# Patient Record
Sex: Female | Born: 1985 | Race: White | Hispanic: No | Marital: Married | Smoking: Former smoker
Health system: Southern US, Community
[De-identification: ages and names within clinical notes are randomized; demographics above are authoritative.]

## PROBLEM LIST (undated history)

## (undated) ENCOUNTER — Inpatient Hospital Stay (HOSPITAL_COMMUNITY): Payer: Self-pay

## (undated) DIAGNOSIS — F419 Anxiety disorder, unspecified: Secondary | ICD-10-CM

## (undated) DIAGNOSIS — E039 Hypothyroidism, unspecified: Secondary | ICD-10-CM

## (undated) DIAGNOSIS — Z8659 Personal history of other mental and behavioral disorders: Secondary | ICD-10-CM

## (undated) DIAGNOSIS — G5600 Carpal tunnel syndrome, unspecified upper limb: Secondary | ICD-10-CM

## (undated) DIAGNOSIS — J3089 Other allergic rhinitis: Secondary | ICD-10-CM

## (undated) DIAGNOSIS — B029 Zoster without complications: Secondary | ICD-10-CM

## (undated) DIAGNOSIS — R0600 Dyspnea, unspecified: Secondary | ICD-10-CM

## (undated) DIAGNOSIS — Z9889 Other specified postprocedural states: Secondary | ICD-10-CM

## (undated) DIAGNOSIS — E669 Obesity, unspecified: Secondary | ICD-10-CM

## (undated) DIAGNOSIS — J302 Other seasonal allergic rhinitis: Secondary | ICD-10-CM

## (undated) DIAGNOSIS — R112 Nausea with vomiting, unspecified: Secondary | ICD-10-CM

## (undated) DIAGNOSIS — T8859XA Other complications of anesthesia, initial encounter: Secondary | ICD-10-CM

## (undated) HISTORY — PX: WISDOM TOOTH EXTRACTION: SHX21

## (undated) HISTORY — PX: MINOR CARPAL TUNNEL: SHX6472

## (undated) HISTORY — PX: CHOLECYSTECTOMY: SHX55

---

## 2015-01-09 ENCOUNTER — Inpatient Hospital Stay (HOSPITAL_COMMUNITY): Payer: BC Managed Care – PPO

## 2015-01-09 ENCOUNTER — Encounter (HOSPITAL_COMMUNITY): Payer: Self-pay | Admitting: *Deleted

## 2015-01-09 ENCOUNTER — Inpatient Hospital Stay (HOSPITAL_COMMUNITY)
Admission: AD | Admit: 2015-01-09 | Discharge: 2015-01-09 | Disposition: A | Discharge status: Home / Self Care | Payer: BC Managed Care – PPO | Source: Ambulatory Visit | Attending: Obstetrics and Gynecology | Admitting: Obstetrics and Gynecology

## 2015-01-09 DIAGNOSIS — O360110 Maternal care for anti-D [Rh] antibodies, first trimester, not applicable or unspecified: Secondary | ICD-10-CM | POA: Diagnosis not present

## 2015-01-09 DIAGNOSIS — O26891 Other specified pregnancy related conditions, first trimester: Secondary | ICD-10-CM | POA: Insufficient documentation

## 2015-01-09 DIAGNOSIS — O4691 Antepartum hemorrhage, unspecified, first trimester: Secondary | ICD-10-CM

## 2015-01-09 DIAGNOSIS — O209 Hemorrhage in early pregnancy, unspecified: Secondary | ICD-10-CM

## 2015-01-09 DIAGNOSIS — Z87891 Personal history of nicotine dependence: Secondary | ICD-10-CM | POA: Insufficient documentation

## 2015-01-09 DIAGNOSIS — R109 Unspecified abdominal pain: Secondary | ICD-10-CM | POA: Diagnosis present

## 2015-01-09 DIAGNOSIS — Z3A01 Less than 8 weeks gestation of pregnancy: Secondary | ICD-10-CM | POA: Insufficient documentation

## 2015-01-09 DIAGNOSIS — Z6791 Unspecified blood type, Rh negative: Secondary | ICD-10-CM | POA: Insufficient documentation

## 2015-01-09 HISTORY — DX: Anxiety disorder, unspecified: F41.9

## 2015-01-09 HISTORY — DX: Obesity, unspecified: E66.9

## 2015-01-09 LAB — URINALYSIS, ROUTINE W REFLEX MICROSCOPIC
BILIRUBIN URINE: NEGATIVE
GLUCOSE, UA: NEGATIVE mg/dL
Ketones, ur: NEGATIVE mg/dL
Leukocytes, UA: NEGATIVE
Nitrite: NEGATIVE
PH: 6 (ref 5.0–8.0)
Protein, ur: NEGATIVE mg/dL
Urobilinogen, UA: 1 mg/dL (ref 0.0–1.0)

## 2015-01-09 LAB — ABO/RH: ABO/RH(D): O NEG

## 2015-01-09 LAB — CBC WITH DIFFERENTIAL/PLATELET
BASOS ABS: 0 10*3/uL (ref 0.0–0.1)
Basophils Relative: 0 % (ref 0–1)
EOS PCT: 1 % (ref 0–5)
Eosinophils Absolute: 0.2 10*3/uL (ref 0.0–0.7)
HEMATOCRIT: 37.6 % (ref 36.0–46.0)
Hemoglobin: 12.8 g/dL (ref 12.0–15.0)
LYMPHS ABS: 4.5 10*3/uL — AB (ref 0.7–4.0)
LYMPHS PCT: 30 % (ref 12–46)
MCH: 28.4 pg (ref 26.0–34.0)
MCHC: 34 g/dL (ref 30.0–36.0)
MCV: 83.4 fL (ref 78.0–100.0)
MONO ABS: 1 10*3/uL (ref 0.1–1.0)
Monocytes Relative: 7 % (ref 3–12)
NEUTROS ABS: 9.1 10*3/uL — AB (ref 1.7–7.7)
Neutrophils Relative %: 62 % (ref 43–77)
PLATELETS: 327 10*3/uL (ref 150–400)
RBC: 4.51 MIL/uL (ref 3.87–5.11)
RDW: 13.9 % (ref 11.5–15.5)
WBC: 14.8 10*3/uL — ABNORMAL HIGH (ref 4.0–10.5)

## 2015-01-09 LAB — WET PREP, GENITAL
Clue Cells Wet Prep HPF POC: NONE SEEN
TRICH WET PREP: NONE SEEN
YEAST WET PREP: NONE SEEN

## 2015-01-09 LAB — URINE MICROSCOPIC-ADD ON

## 2015-01-09 LAB — HCG, QUANTITATIVE, PREGNANCY: hCG, Beta Chain, Quant, S: 17773 m[IU]/mL — ABNORMAL HIGH (ref <5)

## 2015-01-09 LAB — POCT PREGNANCY, URINE: PREG TEST UR: POSITIVE — AB

## 2015-01-09 MED ORDER — RHO D IMMUNE GLOBULIN 1500 UNIT/2ML IJ SOSY
300.0000 ug | PREFILLED_SYRINGE | Freq: Once | INTRAMUSCULAR | Status: AC
  Administered 2015-01-09: 300 ug via INTRAMUSCULAR
  Filled 2015-01-09: qty 2

## 2015-01-09 NOTE — MAU Note (Signed)
Patient states she took 3 positive HPTs and started having cramping and light spotting today.  LMP November 30, 2014.

## 2015-01-09 NOTE — Discharge Instructions (Signed)
Rh Incompatibility Rh incompatibility is a condition that occurs during pregnancy if a woman has Rh-negative blood and her baby has Rh-positive blood. "Rh-negative" and "Rh-positive" refer to whether or not the blood has an Rh factor. An Rh factor is a specific protein found on the surface of red blood cells. If a woman has Rh factor, she is Rh-positive. If she does not have an Rh factor, she is Rh-negative. Having or not having an Rh factor does not affect the mother's general health. However, it can cause problems during pregnancy.  WHAT KIND OF PROBLEMS CAN Rh INCOMPATIBILITY CAUSE? During pregnancy, blood from the baby can cross into the mother's bloodstream, especially during delivery. If a mother is Rh-negative and the baby is Rh-positive, the mother's defense system will react to the baby's blood as if it was a foreign substance and will create proteins (antibodies). This is called sensitization. Once the mother is sensitized, her Rh antibodies will cross the placenta to the baby and attack the baby's Rh-positive blood as if it is a harmful substance.  Rh incompatibility can also happen if the Rh-negative pregnant woman is exposed to the Rh factor during a blood transfusion with Rh-positive blood.  HOW DOES THIS CONDITION AFFECT MY BABY? The Rh antibodies that attack and destroy the baby's red blood cells can lead to hemolytic disease in the baby. Hemolytic disease is when the red blood cells break down. This can cause:   Yellowing of the skin and eyes (jaundice).  The body to not have enough healthy red blood cells (anemia).   Brain damage.   Heart failure.   Death.  These antibodies usually do not cause problems during a first pregnancy. This is because the blood from the baby often times crosses into the mother's bloodstream during delivery, and the baby is born before many of the antibodies can develop. However, the antibodies stay in your body once they have formed. Because of this,  Rh incompatibility is more likely to cause problems in second or later pregnancies (if the baby is Rh-positive).  HOW IS THIS CONDITION DIAGNOSED? When a woman becomes pregnant, blood tests may be done to find out her blood type and Rh factor. If the woman is Rh-negative, she also may have another blood test called an antibody screen. The antibody screen shows whether she has Rh antibodies in her blood. If she does, it means she was exposed to Rh-positive blood before, and she is at risk for Rh incompatibility.  To find out whether the baby is developing hemolytic anemia and how serious it is, caregivers may use more advanced tests, such as ultrasonography (commonly known as ultrasound).  HOW IS Rh INCOMPATIBILITY TREATED?  Rh incompatibility is treated with a shot of medicine called Rho (D) immune globulin. This medicine keeps the woman's body from making antibodies that can cause serious problems in the baby or future babies.  Two shots will be given, one at around your seventh month of pregnancy and the other within 72 hours of your baby being born. If you are Rh-negative, you will need this medicine every time you have a baby with Rh-positive blood. If you already have antibodies in your blood, Rho (D) immune globulin will not help. Your doctor will not give you this medicine, but will watch your pregnancy closely for problems instead.  This shot may also be given to an Rh-negative woman when the risk of blood transfer between the mom and baby is high. The risk is high with:  An amniocentesis.   A miscarriage or an abortion.   An ectopic pregnancy.   Any vaginal bleeding during pregnancy.  Document Released: 10/13/2001 Document Revised: 04/28/2013 Document Reviewed: 08/05/2012 [facility] Patient Information 2015 [place], [place]. This information is not intended to replace advice given to you by your health care provider. Make sure you discuss any questions you have with your health care  provider.  Pelvic Rest Pelvic rest is sometimes recommended for women when:   The placenta is partially or completely covering the opening of the cervix (placenta previa).  There is bleeding between the uterine wall and the amniotic sac in the first trimester (subchorionic hemorrhage).  The cervix begins to open without labor starting (incompetent cervix, cervical insufficiency).  The labor is too early (preterm labor). HOME CARE INSTRUCTIONS  Do not have sexual intercourse, stimulation, or an orgasm.  Do not use tampons, douche, or put anything in the vagina.  Do not lift anything over 10 pounds (4.5 kg).  Avoid strenuous activity or straining your pelvic muscles. SEEK MEDICAL CARE IF:  You have any vaginal bleeding during pregnancy. Treat this as a potential emergency.  You have cramping pain felt low in the stomach (stronger than menstrual cramps).  You notice vaginal discharge (watery, mucus, or bloody).  You have a low, dull backache.  There are regular contractions or uterine tightening. SEEK IMMEDIATE MEDICAL CARE IF: You have vaginal bleeding and have placenta previa.  Document Released: 08/18/2010 Document Revised: 07/16/2011 Document Reviewed: 08/18/2010 [facility] Patient Information 2015 [place], [place]. This information is not intended to replace advice given to you by your health care provider. Make sure you discuss any questions you have with your health care provider.

## 2015-01-09 NOTE — MAU Provider Note (Signed)
History     CSN: [id number]  Arrival date and time: 01/09/15 1648   First Provider Initiated Contact with Patient 01/09/15 1754      Chief Complaint  Patient presents with  . Vaginal Bleeding  . Abdominal Cramping   HPI [name] [name] 29 y.o. G1P0 @[redacted]w[redacted]d  presents to MAU complaining of bleeding and cramping in setting of positive home pregnancy test last week.  She first noted her symptoms a couple hours ago.  It started with some spotting.  It was brownish and noted only with wiping.  Shortly after noting the spotting, she started cramping.  It is 4/10, dull, across both sides of lower abdomen.  No radiation.  Has not noticed aggravating or alleviating factors.  No meds used.  No ice/heat.  Last IC was this morning.  Denies fever, weakness, SOB, syncope, HA.    OB History    Gravida Para Term Preterm AB TAB SAB Ectopic Multiple Living   1               Past Medical History  Diagnosis Date  . Obese   . Anxiety     Past Surgical History  Procedure Laterality Date  . Cholecystectomy    . Minor carpal tunnel      History reviewed. No pertinent family history.  Social History  Substance Use Topics  . Smoking status: Former Smoker    Types: Cigarettes  . Smokeless tobacco: None  . Alcohol Use: No    Allergies:  Allergies  Allergen Reactions  . Ibuprofen Hives  . Sulfa Antibiotics Hives    Prescriptions prior to admission  Medication Sig Dispense Refill Last Dose  . albuterol (PROVENTIL HFA;VENTOLIN HFA) 108 (90 BASE) MCG/ACT inhaler Inhale 1 puff into the lungs every 6 (six) hours as needed for wheezing or shortness of breath.   rescue  . cetirizine (ZYRTEC) 10 MG tablet Take 10 mg by mouth daily.   01/09/2015 at Unknown time  . EPINEPHrine (EPIPEN 2-PAK) 0.3 mg/0.3 mL IJ SOAJ injection Inject 0.3 mg into the muscle once.   rescue  . fluticasone (FLONASE) 50 MCG/ACT nasal spray Place 1 spray into both nostrils daily as needed for allergies or rhinitis.   Past Week at  Unknown time  . levothyroxine (SYNTHROID, LEVOTHROID) 25 MCG tablet Take 25 mcg by mouth daily before breakfast.   01/09/2015 at Unknown time  . montelukast (SINGULAIR) 10 MG tablet Take 10 mg by mouth at bedtime.   01/08/2015 at Unknown time  . Prenatal Vit-Fe Fumarate-FA (PRENATAL MULTIVITAMIN) TABS tablet Take 1 tablet by mouth daily at 12 noon.   01/08/2015 at Unknown time  . venlafaxine XR (EFFEXOR-XR) 150 MG 24 hr capsule Take 150 mg by mouth daily with breakfast.   01/09/2015 at Unknown time    ROS Pertinent ROS in HPI.  All other systems are negative.   Physical Exam   Blood pressure 123/58, pulse 104, temperature 98.3 F (36.8 C), temperature source Oral, resp. rate 18, height 5\' 6"  (1.676 m), weight 307 lb 3.2 oz (139.345 kg), last menstrual period 11/30/2014, SpO2 100 %.  Physical Exam  Constitutional: She appears well-developed and well-nourished. No distress.  HENT:  Head: Normocephalic and atraumatic.  Eyes: EOM are normal.  Neck: Normal range of motion.  Cardiovascular: Normal rate.   Respiratory: Effort normal. No respiratory distress.  GI: Soft.  Genitourinary:  Scant tan colored discharge.  No active bleeding from os.  Poor exam secondary to habitus.  Unable to appreciate fundus  or anything in adnexa.  No pain with bimanual exam.  Musculoskeletal: Normal range of motion.  Neurological: She is alert.  Skin: Skin is warm and dry.  Psychiatric: She has a normal mood and affect.   Results for orders placed or performed during the hospital encounter of 01/09/15 (from the past 24 hour(s))  Urinalysis, Routine w reflex microscopic (not at [facility])     Status: Abnormal   Collection Time: 01/09/15  5:20 PM  Result Value Ref Range   Color, Urine YELLOW YELLOW   APPearance CLEAR CLEAR   Specific Gravity, Urine >1.030 (H) 1.005 - 1.030   pH 6.0 5.0 - 8.0   Glucose, UA NEGATIVE NEGATIVE mg/dL   Hgb urine dipstick MODERATE (A) NEGATIVE   Bilirubin Urine NEGATIVE NEGATIVE   Ketones,  ur NEGATIVE NEGATIVE mg/dL   Protein, ur NEGATIVE NEGATIVE mg/dL   Urobilinogen, UA 1.0 0.0 - 1.0 mg/dL   Nitrite NEGATIVE NEGATIVE   Leukocytes, UA NEGATIVE NEGATIVE  Urine microscopic-add on     Status: Abnormal   Collection Time: 01/09/15  5:20 PM  Result Value Ref Range   Squamous Epithelial / LPF FEW (A) RARE   WBC, UA 0-2 <3 WBC/hpf   RBC / HPF 0-2 <3 RBC/hpf  Pregnancy, urine POC     Status: Abnormal   Collection Time: 01/09/15  5:44 PM  Result Value Ref Range   Preg Test, Ur POSITIVE (A) NEGATIVE  Wet prep, genital     Status: Abnormal   Collection Time: 01/09/15  6:05 PM  Result Value Ref Range   Yeast Wet Prep HPF POC NONE SEEN NONE SEEN   Trich, Wet Prep NONE SEEN NONE SEEN   Clue Cells Wet Prep HPF POC NONE SEEN NONE SEEN   WBC, Wet Prep HPF POC FEW (A) NONE SEEN  CBC with Differential/Platelet     Status: Abnormal   Collection Time: 01/09/15  6:20 PM  Result Value Ref Range   WBC 14.8 (H) 4.0 - 10.5 K/uL   RBC 4.51 3.87 - 5.11 MIL/uL   Hemoglobin 12.8 12.0 - 15.0 g/dL   HCT 37.6 36.0 - 46.0 %   MCV 83.4 78.0 - 100.0 fL   MCH 28.4 26.0 - 34.0 pg   MCHC 34.0 30.0 - 36.0 g/dL   RDW 13.9 11.5 - 15.5 %   Platelets 327 150 - 400 K/uL   Neutrophils Relative % 62 43 - 77 %   Neutro Abs 9.1 (H) 1.7 - 7.7 K/uL   Lymphocytes Relative 30 12 - 46 %   Lymphs Abs 4.5 (H) 0.7 - 4.0 K/uL   Monocytes Relative 7 3 - 12 %   Monocytes Absolute 1.0 0.1 - 1.0 K/uL   Eosinophils Relative 1 0 - 5 %   Eosinophils Absolute 0.2 0.0 - 0.7 K/uL   Basophils Relative 0 0 - 1 %   Basophils Absolute 0.0 0.0 - 0.1 K/uL  ABO/Rh     Status: None (Preliminary result)   Collection Time: 01/09/15  6:20 PM  Result Value Ref Range   ABO/RH(D) O NEG   hCG, quantitative, pregnancy     Status: Abnormal   Collection Time: 01/09/15  6:20 PM  Result Value Ref Range   hCG, Beta Chain, Quant, S 17773 (H) <5 mIU/mL   US Ob Comp Less 14 Wks  01/09/2015   CLINICAL DATA:  29 year old female pregnant  patient with pelvic cramping and vaginal spotting.  EXAM: OBSTETRIC <14 WK [place] AND TRANSVAGINAL OB US  TECHNIQUE: Both transabdominal and transvaginal ultrasound examinations were performed for complete evaluation of the gestation as well as the maternal uterus, adnexal regions, and pelvic cul-de-sac. Transvaginal technique was performed to assess early pregnancy.  COMPARISON:  No priors.  FINDINGS: Intrauterine gestational sac: Single gestational sac noted in the fundal portion of the endometrial canal.  Yolk sac:  Present.  Embryo:  Present.  Cardiac Activity: Present.  Heart Rate: 110  bpm  CRL:  4.5  mm   6 w   1 d                  [place] EDC: 09/03/2015  Maternal uterus/adnexae: Small area adjacent to the gestational sac in the endometrial canal demonstrating heterogeneous hypoechoic echotexture reportedly corresponded to some prominent vessels per discussion with sonographer. No definite subchorionic hemorrhage. Bilateral ovaries are normal in appearance. No significant free fluid within the cul-de-sac.  IMPRESSION: 1. Single viable IUP with estimated gestational age of [redacted] weeks and 1 day and normal fetal heart rate of 110 beats per minute. 2. No acute findings.   Electronically Signed   By: Vinnie Langton M.D.   On: 01/09/2015 19:32   US Ob Transvaginal  01/09/2015   CLINICAL DATA:  29 year old female pregnant patient with pelvic cramping and vaginal spotting.  EXAM: OBSTETRIC <14 WK [place] AND TRANSVAGINAL OB US  TECHNIQUE: Both transabdominal and transvaginal ultrasound examinations were performed for complete evaluation of the gestation as well as the maternal uterus, adnexal regions, and pelvic cul-de-sac. Transvaginal technique was performed to assess early pregnancy.  COMPARISON:  No priors.  FINDINGS: Intrauterine gestational sac: Single gestational sac noted in the fundal portion of the endometrial canal.  Yolk sac:  Present.  Embryo:  Present.  Cardiac Activity: Present.  Heart Rate: 110  bpm  CRL:  4.5   mm   6 w   1 d                  [place] EDC: 09/03/2015  Maternal uterus/adnexae: Small area adjacent to the gestational sac in the endometrial canal demonstrating heterogeneous hypoechoic echotexture reportedly corresponded to some prominent vessels per discussion with sonographer. No definite subchorionic hemorrhage. Bilateral ovaries are normal in appearance. No significant free fluid within the cul-de-sac.  IMPRESSION: 1. Single viable IUP with estimated gestational age of [redacted] weeks and 1 day and normal fetal heart rate of 110 beats per minute. 2. No acute findings.   Electronically Signed   By: Vinnie Langton M.D.   On: 01/09/2015 19:32    MAU Course  Procedures  MDM Ectopic workup ordered to eval vaginal spotting in setting of positive pregnancy test Per U/S pregnancy is intrauterine and viable at [redacted]w[redacted]d with cardiac activity.  Images reviewed Lab indicates negative blood type therefore pt requires rh workup and rhophylac.    Assessment and Plan  A: Vaginal bleeding in early pregnancy Rh negative status - Rhophylac in MAU  P: Discharge to home Follow up with OB care provider for [facility] asap PNV asap Any worsening of symptoms, return to MAU  Jaclyn Prime, Collene Leyden 01/09/2015, 5:55 PM

## 2015-01-10 LAB — RPR: RPR: NONREACTIVE

## 2015-01-10 LAB — HIV ANTIBODY (ROUTINE TESTING W REFLEX): HIV SCREEN 4TH GENERATION: NONREACTIVE

## 2015-01-11 LAB — RH IG WORKUP (INCLUDES ABO/RH)
ABO/RH(D): O NEG
Antibody Screen: NEGATIVE
GESTATIONAL AGE(WKS): 6
UNIT DIVISION: 0

## 2015-01-11 LAB — GC/CHLAMYDIA PROBE AMP (~~LOC~~) NOT AT ARMC
CHLAMYDIA, DNA PROBE: NEGATIVE
NEISSERIA GONORRHEA: NEGATIVE

## 2015-02-17 LAB — OB RESULTS CONSOLE RUBELLA ANTIBODY, IGM: Rubella: IMMUNE

## 2015-02-17 LAB — OB RESULTS CONSOLE GC/CHLAMYDIA: Gonorrhea: NEGATIVE

## 2015-02-17 LAB — OB RESULTS CONSOLE HEPATITIS B SURFACE ANTIGEN: HEP B S AG: NEGATIVE

## 2015-06-19 ENCOUNTER — Inpatient Hospital Stay (HOSPITAL_COMMUNITY): Payer: BC Managed Care – PPO

## 2015-06-19 ENCOUNTER — Encounter (HOSPITAL_COMMUNITY): Payer: Self-pay | Admitting: *Deleted

## 2015-06-19 ENCOUNTER — Inpatient Hospital Stay (HOSPITAL_COMMUNITY)
Admission: AD | Admit: 2015-06-19 | Discharge: 2015-06-27 | DRG: 765 | Disposition: A | Discharge status: Home / Self Care | Payer: BC Managed Care – PPO | Source: Ambulatory Visit | Attending: Obstetrics and Gynecology | Admitting: Obstetrics and Gynecology

## 2015-06-19 DIAGNOSIS — O99214 Obesity complicating childbirth: Secondary | ICD-10-CM | POA: Diagnosis present

## 2015-06-19 DIAGNOSIS — O42919 Preterm premature rupture of membranes, unspecified as to length of time between rupture and onset of labor, unspecified trimester: Secondary | ICD-10-CM | POA: Diagnosis not present

## 2015-06-19 DIAGNOSIS — Z87891 Personal history of nicotine dependence: Secondary | ICD-10-CM

## 2015-06-19 DIAGNOSIS — Z3A29 29 weeks gestation of pregnancy: Secondary | ICD-10-CM | POA: Diagnosis not present

## 2015-06-19 DIAGNOSIS — F419 Anxiety disorder, unspecified: Secondary | ICD-10-CM | POA: Diagnosis present

## 2015-06-19 DIAGNOSIS — O9902 Anemia complicating childbirth: Secondary | ICD-10-CM | POA: Diagnosis present

## 2015-06-19 DIAGNOSIS — O99344 Other mental disorders complicating childbirth: Secondary | ICD-10-CM | POA: Diagnosis present

## 2015-06-19 DIAGNOSIS — Z6841 Body Mass Index (BMI) 40.0 and over, adult: Secondary | ICD-10-CM

## 2015-06-19 DIAGNOSIS — E039 Hypothyroidism, unspecified: Secondary | ICD-10-CM | POA: Diagnosis present

## 2015-06-19 DIAGNOSIS — O99284 Endocrine, nutritional and metabolic diseases complicating childbirth: Secondary | ICD-10-CM | POA: Diagnosis present

## 2015-06-19 DIAGNOSIS — O42913 Preterm premature rupture of membranes, unspecified as to length of time between rupture and onset of labor, third trimester: Principal | ICD-10-CM | POA: Diagnosis present

## 2015-06-19 HISTORY — DX: Other seasonal allergic rhinitis: J30.2

## 2015-06-19 HISTORY — DX: Other allergic rhinitis: J30.89

## 2015-06-19 HISTORY — DX: Carpal tunnel syndrome, unspecified upper limb: G56.00

## 2015-06-19 HISTORY — DX: Hypothyroidism, unspecified: E03.9

## 2015-06-19 HISTORY — DX: Personal history of other mental and behavioral disorders: Z86.59

## 2015-06-19 LAB — CBC
HEMATOCRIT: 32.6 % — AB (ref 36.0–46.0)
HEMOGLOBIN: 10.9 g/dL — AB (ref 12.0–15.0)
MCH: 27.5 pg (ref 26.0–34.0)
MCHC: 33.4 g/dL (ref 30.0–36.0)
MCV: 82.3 fL (ref 78.0–100.0)
Platelets: 288 10*3/uL (ref 150–400)
RBC: 3.96 MIL/uL (ref 3.87–5.11)
RDW: 13.7 % (ref 11.5–15.5)
WBC: 20.5 10*3/uL — ABNORMAL HIGH (ref 4.0–10.5)

## 2015-06-19 LAB — AMNISURE RUPTURE OF MEMBRANE (ROM) NOT AT ARMC: Amnisure ROM: POSITIVE

## 2015-06-19 LAB — TYPE AND SCREEN
ABO/RH(D): O NEG
Antibody Screen: NEGATIVE

## 2015-06-19 LAB — URINALYSIS, ROUTINE W REFLEX MICROSCOPIC
Bilirubin Urine: NEGATIVE
GLUCOSE, UA: NEGATIVE mg/dL
Hgb urine dipstick: NEGATIVE
Ketones, ur: NEGATIVE mg/dL
LEUKOCYTES UA: NEGATIVE
Nitrite: NEGATIVE
PH: 7 (ref 5.0–8.0)
Protein, ur: NEGATIVE mg/dL
Specific Gravity, Urine: 1.015 (ref 1.005–1.030)

## 2015-06-19 LAB — POCT FERN TEST: POCT Fern Test: POSITIVE

## 2015-06-19 MED ORDER — VENLAFAXINE HCL ER 150 MG PO CP24
150.0000 mg | ORAL_CAPSULE | Freq: Every day | ORAL | Status: DC
  Filled 2015-06-19 (×2): qty 1

## 2015-06-19 MED ORDER — SODIUM CHLORIDE 0.9% FLUSH
3.0000 mL | INTRAVENOUS | Status: DC | PRN
  Administered 2015-06-21: 3 mL via INTRAVENOUS
  Filled 2015-06-19: qty 3

## 2015-06-19 MED ORDER — SODIUM CHLORIDE 0.9% FLUSH
3.0000 mL | Freq: Two times a day (BID) | INTRAVENOUS | Status: DC
  Administered 2015-06-21 – 2015-06-23 (×5): 3 mL via INTRAVENOUS

## 2015-06-19 MED ORDER — PRENATAL MULTIVITAMIN CH
1.0000 | ORAL_TABLET | Freq: Every day | ORAL | Status: DC
  Administered 2015-06-19 – 2015-06-22 (×4): 1 via ORAL
  Filled 2015-06-19 (×5): qty 1

## 2015-06-19 MED ORDER — MONTELUKAST SODIUM 10 MG PO TABS
10.0000 mg | ORAL_TABLET | Freq: Every day | ORAL | Status: DC
  Administered 2015-06-19 – 2015-06-22 (×4): 10 mg via ORAL
  Filled 2015-06-19 (×5): qty 1

## 2015-06-19 MED ORDER — AMOXICILLIN 500 MG PO CAPS
500.0000 mg | ORAL_CAPSULE | Freq: Three times a day (TID) | ORAL | Status: DC
  Administered 2015-06-21 – 2015-06-23 (×7): 500 mg via ORAL
  Filled 2015-06-19 (×10): qty 1

## 2015-06-19 MED ORDER — SODIUM CHLORIDE 0.9 % IV SOLN: 250.0000 mL | INTRAVENOUS | Status: DC | PRN

## 2015-06-19 MED ORDER — FLUTICASONE PROPIONATE 50 MCG/ACT NA SUSP
1.0000 | Freq: Every day | NASAL | Status: DC | PRN
  Filled 2015-06-19: qty 16

## 2015-06-19 MED ORDER — BETAMETHASONE SOD PHOS & ACET 6 (3-3) MG/ML IJ SUSP
12.0000 mg | INTRAMUSCULAR | Status: AC
  Administered 2015-06-19 – 2015-06-20 (×2): 12 mg via INTRAMUSCULAR
  Filled 2015-06-19 (×2): qty 2

## 2015-06-19 MED ORDER — LEVOTHYROXINE SODIUM 25 MCG PO TABS
25.0000 ug | ORAL_TABLET | Freq: Every day | ORAL | Status: DC
Start: 2015-06-20 — End: 2015-06-23
  Administered 2015-06-20 – 2015-06-23 (×4): 25 ug via ORAL
  Filled 2015-06-19 (×6): qty 1

## 2015-06-19 MED ORDER — CALCIUM CARBONATE ANTACID 500 MG PO CHEW
2.0000 | CHEWABLE_TABLET | ORAL | Status: DC | PRN
Start: 2015-06-19 — End: 2015-06-23

## 2015-06-19 MED ORDER — ZOLPIDEM TARTRATE 5 MG PO TABS: 5.0000 mg | ORAL_TABLET | Freq: Every evening | ORAL | Status: DC | PRN

## 2015-06-19 MED ORDER — AZITHROMYCIN 500 MG PO TABS
500.0000 mg | ORAL_TABLET | Freq: Every day | ORAL | Status: DC
  Administered 2015-06-19 – 2015-06-23 (×5): 500 mg via ORAL
  Filled 2015-06-19 (×2): qty 2
  Filled 2015-06-19: qty 1
  Filled 2015-06-19 (×3): qty 2

## 2015-06-19 MED ORDER — HYDROCORTISONE 1 % EX CREA
1.0000 "application " | TOPICAL_CREAM | Freq: Two times a day (BID) | CUTANEOUS | Status: DC
  Administered 2015-06-19 – 2015-06-22 (×4): 1 via TOPICAL
  Filled 2015-06-19: qty 28

## 2015-06-19 MED ORDER — ACETAMINOPHEN 325 MG PO TABS: 650.0000 mg | ORAL_TABLET | ORAL | Status: DC | PRN

## 2015-06-19 MED ORDER — LORATADINE 10 MG PO TABS
10.0000 mg | ORAL_TABLET | Freq: Every day | ORAL | Status: DC
  Administered 2015-06-20 – 2015-06-23 (×4): 10 mg via ORAL
  Filled 2015-06-19 (×5): qty 1

## 2015-06-19 MED ORDER — SODIUM CHLORIDE 0.9 % IV SOLN
2.0000 g | Freq: Four times a day (QID) | INTRAVENOUS | Status: AC
  Administered 2015-06-19 – 2015-06-21 (×8): 2 g via INTRAVENOUS
  Filled 2015-06-19 (×10): qty 2000

## 2015-06-19 MED ORDER — ALBUTEROL SULFATE (2.5 MG/3ML) 0.083% IN NEBU: 3.0000 mL | INHALATION_SOLUTION | Freq: Four times a day (QID) | RESPIRATORY_TRACT | Status: DC | PRN

## 2015-06-19 MED ORDER — DOCUSATE SODIUM 100 MG PO CAPS
100.0000 mg | ORAL_CAPSULE | Freq: Every day | ORAL | Status: DC
  Administered 2015-06-20 – 2015-06-23 (×4): 100 mg via ORAL
  Filled 2015-06-19 (×6): qty 1

## 2015-06-19 MED ORDER — PRENATAL MULTIVITAMIN CH: 1.0000 | ORAL_TABLET | Freq: Every day | ORAL | Status: DC

## 2015-06-19 NOTE — H&P (Addendum)
[name] [name] is a 30 y.o. female G1P0 @ 29+3 wks presenting for ROM.  Denies ctx & vb.  + FM  History OB History    Gravida Para Term Preterm AB TAB SAB Ectopic Multiple Living   1         0     Past Medical History  Diagnosis Date  . Obese   . Anxiety   . Hypothyroid   . Seasonal allergies   . Environmental and seasonal allergies    Past Surgical History  Procedure Laterality Date  . Cholecystectomy    . Minor carpal tunnel     Family History: family history is not on file. Social History:  reports that she has quit smoking. Her smoking use included Cigarettes. She does not have any smokeless tobacco history on file. She reports that she does not drink alcohol or use illicit drugs.   Prenatal Transfer Tool  Maternal Diabetes: No Genetic Screening: Normal Maternal Ultrasounds/Referrals: Normal Fetal Ultrasounds or other Referrals:  None Maternal Substance Abuse:  No Significant Maternal Medications:  None Significant Maternal Lab Results:  None Other Comments:  None  ROS  Dilation: 1 Effacement (%): 50 Exam by:: Robyne Askew, NP Blood pressure 129/75, pulse 110, temperature 97.9 F (36.6 C), temperature source Oral, resp. rate 18, last menstrual period 11/30/2014, SpO2 99 %. Exam Physical Exam  Gen - NAD Abd - gravid, NT Ext - NT, no edema PV (by NP @ MAU)  + pool, no bleeding,  + fern & amniosure.  1cm dilated Prenatal labs: ABO, Rh: --/--/O NEG, O NEG (09/04 1820) Antibody: NEG (09/04 1820) Rubella:   RPR: Non Reactive (09/04 1820)  HBsAg:    HIV: Non Reactive (09/04 1820)  GBS:     Assessment/Plan:  29+3 weeks with PPROM Admit BMZ series Latency abx Ultrasound    [name] [name] 06/19/2015, 2:28 PM

## 2015-06-19 NOTE — MAU Note (Signed)
Pt C/O leaking that started around 1210 today, felt wet & changed pantiliner, then soaked through her pants, clear fluid.   Has some mid abdominal discomfort.  Denies bleeding.

## 2015-06-19 NOTE — MAU Note (Signed)
No obvious leaking at this time. Fern slide pending.

## 2015-06-19 NOTE — MAU Provider Note (Signed)
History     CSN: [id number]  Arrival date and time: 06/19/15 1247   First Provider Initiated Contact with Patient 06/19/15 1351       Chief Complaint  Patient presents with  . Rupture of Membranes   HPI [name] [name] is a 30 y.o. G1P0 at [redacted]w[redacted]d who presents with r/o SROM. Reports leaking clear fluid since noon today. Has continued leaking. Denies abdominal pain or vaginal bleeding. Positive fetal movement.  Denies complications with pregnancy.    OB History    Gravida Para Term Preterm AB TAB SAB Ectopic Multiple Living   1         0      Past Medical History  Diagnosis Date  . Obese   . Anxiety   . Hypothyroid   . Seasonal allergies   . Environmental and seasonal allergies     Past Surgical History  Procedure Laterality Date  . Cholecystectomy    . Minor carpal tunnel      History reviewed. No pertinent family history.  Social History  Substance Use Topics  . Smoking status: Former Smoker    Types: Cigarettes  . Smokeless tobacco: None  . Alcohol Use: No    Allergies:  Allergies  Allergen Reactions  . Ibuprofen Hives  . Sulfa Antibiotics Hives    Prescriptions prior to admission  Medication Sig Dispense Refill Last Dose  . calcium carbonate (TUMS - DOSED IN MG ELEMENTAL CALCIUM) 500 MG chewable tablet Chew 1 tablet by mouth daily.   Past Week at Unknown time  . cetirizine (ZYRTEC) 10 MG tablet Take 10 mg by mouth daily.   06/19/2015 at Unknown time  . hydrocortisone cream 1 % Apply 1 application topically 2 (two) times daily.   06/19/2015 at Unknown time  . levothyroxine (SYNTHROID, LEVOTHROID) 25 MCG tablet Take 25 mcg by mouth daily before breakfast.   06/19/2015 at Unknown time  . montelukast (SINGULAIR) 10 MG tablet Take 10 mg by mouth at bedtime.   06/18/2015 at Unknown time  . Prenatal Vit-Fe Fumarate-FA (PRENATAL MULTIVITAMIN) TABS tablet Take 1 tablet by mouth daily at 12 noon.   06/18/2015 at Unknown time  . venlafaxine XR (EFFEXOR-XR) 150 MG 24  hr capsule Take 150 mg by mouth daily with breakfast.   06/19/2015 at Unknown time  . albuterol (PROVENTIL HFA;VENTOLIN HFA) 108 (90 BASE) MCG/ACT inhaler Inhale 1 puff into the lungs every 6 (six) hours as needed for wheezing or shortness of breath.   rescue  . EPINEPHrine (EPIPEN 2-PAK) 0.3 mg/0.3 mL IJ SOAJ injection Inject 0.3 mg into the muscle once.   rescue  . fluticasone (FLONASE) 50 MCG/ACT nasal spray Place 1 spray into both nostrils daily as needed for allergies or rhinitis.   prn    Review of Systems  Constitutional: Negative for fever and chills.  Gastrointestinal: Negative.   Genitourinary: Negative for dysuria.       + LOF No vaginal bleeding   Physical Exam   Blood pressure 129/75, pulse 110, temperature 97.9 F (36.6 C), temperature source Oral, resp. rate 18, last menstrual period 11/30/2014, SpO2 99 %.  Physical Exam  Nursing note and vitals reviewed. Constitutional: She is oriented to person, place, and time. She appears well-developed and well-nourished. No distress.  HENT:  Head: Normocephalic and atraumatic.  Eyes: Conjunctivae are normal. Right eye exhibits no discharge. Left eye exhibits no discharge. No scleral icterus.  Neck: Normal range of motion.  Cardiovascular:  No murmur heard. Respiratory: Effort normal.  No respiratory distress.  Genitourinary:  + pooling. Moderate amount of clear watery fluid.   Neurological: She is alert and oriented to person, place, and time.  Skin: Skin is warm and dry. She is not diaphoretic.  Psychiatric: She has a normal mood and affect. Her behavior is normal. Judgment and thought content normal.   Fetal Tracing:  Baseline: 150 Variability: moderate Accelerations: 15x15 Decelerations: none   Toco: none   MAU Course  Procedures Results for orders placed or performed during the hospital encounter of 06/19/15 (from the past 24 hour(s))  Urinalysis, Routine w reflex microscopic (not at [facility])     Status: None    Collection Time: 06/19/15  1:12 PM  Result Value Ref Range   Color, Urine YELLOW YELLOW   APPearance CLEAR CLEAR   Specific Gravity, Urine 1.015 1.005 - 1.030   pH 7.0 5.0 - 8.0   Glucose, UA NEGATIVE NEGATIVE mg/dL   Hgb urine dipstick NEGATIVE NEGATIVE   Bilirubin Urine NEGATIVE NEGATIVE   Ketones, ur NEGATIVE NEGATIVE mg/dL   Protein, ur NEGATIVE NEGATIVE mg/dL   Nitrite NEGATIVE NEGATIVE   Leukocytes, UA NEGATIVE NEGATIVE  Amnisure rupture of membrane (rom)not at [facility]     Status: None   Collection Time: 06/19/15  1:44 PM  Result Value Ref Range   Amnisure ROM POSITIVE   Fern Test     Status: None   Collection Time: 06/19/15  1:50 PM  Result Value Ref Range   POCT Fern Test Positive = ruptured amniotic membanes     MDM Pooling +, FERN +, amnisure + Category 1 tracing, no contractions 1410- S/w Dr. Julien Girt. Notified that NICU is full, will call neonatologist for admission.   Assessment and Plan  PPROM @ [redacted]w[redacted]d  [name] 06/19/2015, 1:47 PM

## 2015-06-20 LAB — CBC
HCT: 30.5 % — ABNORMAL LOW (ref 36.0–46.0)
HEMOGLOBIN: 10 g/dL — AB (ref 12.0–15.0)
MCH: 27.2 pg (ref 26.0–34.0)
MCHC: 32.8 g/dL (ref 30.0–36.0)
MCV: 83.1 fL (ref 78.0–100.0)
Platelets: 283 10*3/uL (ref 150–400)
RBC: 3.67 MIL/uL — AB (ref 3.87–5.11)
RDW: 13.7 % (ref 11.5–15.5)
WBC: 22.4 10*3/uL — AB (ref 4.0–10.5)

## 2015-06-20 LAB — COMPREHENSIVE METABOLIC PANEL
ALK PHOS: 104 U/L (ref 38–126)
ALT: 19 U/L (ref 14–54)
AST: 27 U/L (ref 15–41)
Albumin: 3 g/dL — ABNORMAL LOW (ref 3.5–5.0)
Anion gap: 9 (ref 5–15)
BILIRUBIN TOTAL: 0.3 mg/dL (ref 0.3–1.2)
BUN: 6 mg/dL (ref 6–20)
CALCIUM: 8.6 mg/dL — AB (ref 8.9–10.3)
CO2: 22 mmol/L (ref 22–32)
CREATININE: 0.46 mg/dL (ref 0.44–1.00)
Chloride: 107 mmol/L (ref 101–111)
GFR calc Af Amer: >60 mL/min (ref >60)
Glucose, Bld: 181 mg/dL — ABNORMAL HIGH (ref 65–99)
Potassium: 3.2 mmol/L — ABNORMAL LOW (ref 3.5–5.1)
SODIUM: 138 mmol/L (ref 135–145)
TOTAL PROTEIN: 6.7 g/dL (ref 6.5–8.1)

## 2015-06-20 LAB — URIC ACID: URIC ACID, SERUM: 3.1 mg/dL (ref 2.3–6.6)

## 2015-06-20 MED ORDER — LACTATED RINGERS IV BOLUS (SEPSIS)
250.0000 mL | Freq: Once | INTRAVENOUS | Status: AC
  Administered 2015-06-20 (×2): 1000 mL via INTRAVENOUS

## 2015-06-20 MED ORDER — VENLAFAXINE HCL ER 75 MG PO CP24: 75.0000 mg | ORAL_CAPSULE | Freq: Every day | ORAL | Status: DC

## 2015-06-20 MED ORDER — VENLAFAXINE HCL ER 75 MG PO CP24
75.0000 mg | ORAL_CAPSULE | Freq: Every day | ORAL | Status: DC
  Administered 2015-06-20 – 2015-06-23 (×4): 75 mg via ORAL
  Filled 2015-06-20 (×5): qty 1

## 2015-06-20 NOTE — Progress Notes (Signed)
29 4/7 weeks  No HA, no vision change, no epigastric pain No bleeding, no UCs  Filed Vitals:   06/20/15 0500 06/20/15 0807  BP:  149/102  Pulse:  124  Temp:    Resp: 16 20   Lungs CTA Cor RRR Abdomen no epigastric tenderness Uterus soft, NT DTR 2+ without clonus FHT + accels UCs none tracing  U/S  Vtx  A/P: PPROM at 29 4/7 weeks         Elevated BP this am         PIH labs         IV fluid bolus         NICU consult

## 2015-06-21 LAB — CULTURE, BETA STREP (GROUP B ONLY)

## 2015-06-21 NOTE — Consult Note (Signed)
[place] 06/21/2015    9:19 AM  Neonatal Medicine Consultation         [name] [name]          MRN:  [id number]  I was called at the request of the patient's obstetrician (Dr. Julien Girt) to speak to this patient due to potential preterm delivery as early as 10 weeks.  The patient's prenatal course includes premature rupture of membranes and elevated blood pressure recently.  She is 29 5/7 weeks currently.  She is admitted to antenatal unit, and is receiving treatment that includes betamethasone (completed), antibiotics.  She is being evaluated for PIH.  The baby is a female, and is mom's first baby.  I reviewed expectations for a baby born at 41+ weeks, including survival, length of stay, morbidities such as respiratory distress, IVH, infection, feeding intolerance, retinopathy.  I described how we provide respiratory and feeding support.  Mom was not planning to breast feed, but I urged her to do so at least while her baby is in the NICU given the numerous benefits to her baby.  I let mom know that the baby's outlook generally improves the longer she remains undelivered.  Mom describes her fluid leaking to be improved since admission.  Hopefully she will remain stable and undelivered for many more days.  I spent 20 minutes reviewing the record, speaking to the patient, and entering appropriate documentation.  More than 50% of the time was spent face to face with patient.   _____________________ Electronically Signed By: Roosevelt Locks, MD Neonatologist

## 2015-06-21 NOTE — Progress Notes (Signed)
S:  Patient is doing well.  No complaints.  Leaking small amount.  O:  BP 123/66 mmHg  Pulse 110  Temp(Src) 98.7 F (37.1 C) (Oral)  Resp 16  Ht 5' 5.5" (1.664 m)  Wt 138.347 kg (305 lb)  BMI 49.96 kg/m2  SpO2 100%  LMP 11/30/2014 (Approximate) Results for orders placed or performed during the hospital encounter of 06/19/15 (from the past 24 hour(s))  CBC     Status: Abnormal   Collection Time: 06/20/15  8:59 AM  Result Value Ref Range   WBC 22.4 (H) 4.0 - 10.5 K/uL   RBC 3.67 (L) 3.87 - 5.11 MIL/uL   Hemoglobin 10.0 (L) 12.0 - 15.0 g/dL   HCT 30.5 (L) 36.0 - 46.0 %   MCV 83.1 78.0 - 100.0 fL   MCH 27.2 26.0 - 34.0 pg   MCHC 32.8 30.0 - 36.0 g/dL   RDW 13.7 11.5 - 15.5 %   Platelets 283 150 - 400 K/uL  Comprehensive metabolic panel     Status: Abnormal   Collection Time: 06/20/15  8:59 AM  Result Value Ref Range   Sodium 138 135 - 145 mmol/L   Potassium 3.2 (L) 3.5 - 5.1 mmol/L   Chloride 107 101 - 111 mmol/L   CO2 22 22 - 32 mmol/L   Glucose, Bld 181 (H) 65 - 99 mg/dL   BUN 6 6 - 20 mg/dL   Creatinine, Ser 0.46 0.44 - 1.00 mg/dL   Calcium 8.6 (L) 8.9 - 10.3 mg/dL   Total Protein 6.7 6.5 - 8.1 g/dL   Albumin 3.0 (L) 3.5 - 5.0 g/dL   AST 27 15 - 41 U/L   ALT 19 14 - 54 U/L   Alkaline Phosphatase 104 38 - 126 U/L   Total Bilirubin 0.3 0.3 - 1.2 mg/dL   GFR calc non Af Amer >60 >60 mL/min   GFR calc Af Amer >60 >60 mL/min   Anion gap 9 5 - 15  Uric acid     Status: None   Collection Time: 06/20/15  8:59 AM  Result Value Ref Range   Uric Acid, Serum 3.1 2.3 - 6.6 mg/dL   Abdomen is soft and non tender Uterus is non tender  IMPRESSION: IUP at 29 w 5 days PPROM on 2/12  PLAN: Continue latency antibiotics Status post steroids Expectant management.

## 2015-06-22 LAB — TYPE AND SCREEN
ABO/RH(D): O NEG
ANTIBODY SCREEN: NEGATIVE

## 2015-06-22 NOTE — Progress Notes (Signed)
No c/o.  Rare LOF.  Active FM.  No CTX, f/c or abdominal pain.  VSS. AF.  FHT Cat I last pm  Gen: A&O x 3 Abd: soft, NT, gravid Ext: no c/c/e  29yo G1 at [redacted]w[redacted]d with PPROM -D4/7 of latency abx -s/p BMZ -Monitor for s/sx of chorio -VTX on last u/s 2/12  Linda Hedges, DO

## 2015-06-23 ENCOUNTER — Inpatient Hospital Stay (HOSPITAL_COMMUNITY): Payer: BC Managed Care – PPO | Admitting: Anesthesiology

## 2015-06-23 ENCOUNTER — Encounter (HOSPITAL_COMMUNITY)
Admission: AD | Disposition: A | Discharge status: Home / Self Care | Payer: Self-pay | Source: Ambulatory Visit | Attending: Obstetrics and Gynecology

## 2015-06-23 ENCOUNTER — Encounter (HOSPITAL_COMMUNITY): Payer: Self-pay | Admitting: Anesthesiology

## 2015-06-23 SURGERY — Surgical Case
Anesthesia: Regional

## 2015-06-23 MED ORDER — SIMETHICONE 80 MG PO CHEW
80.0000 mg | CHEWABLE_TABLET | Freq: Three times a day (TID) | ORAL | Status: DC
Start: 2015-06-24 — End: 2015-06-27
  Administered 2015-06-24 – 2015-06-27 (×10): 80 mg via ORAL
  Filled 2015-06-23 (×11): qty 1

## 2015-06-23 MED ORDER — ONDANSETRON HCL 4 MG/2ML IJ SOLN
INTRAMUSCULAR | Status: AC
  Filled 2015-06-23: qty 2

## 2015-06-23 MED ORDER — ONDANSETRON HCL 4 MG/2ML IJ SOLN: 4.0000 mg | Freq: Three times a day (TID) | INTRAMUSCULAR | Status: DC | PRN

## 2015-06-23 MED ORDER — PROPOFOL 10 MG/ML IV BOLUS
INTRAVENOUS | Status: AC
  Filled 2015-06-23: qty 20

## 2015-06-23 MED ORDER — PRENATAL MULTIVITAMIN CH
1.0000 | ORAL_TABLET | Freq: Every day | ORAL | Status: DC
Start: 2015-06-24 — End: 2015-06-27
  Administered 2015-06-24 – 2015-06-27 (×4): 1 via ORAL
  Filled 2015-06-23 (×4): qty 1

## 2015-06-23 MED ORDER — DEXTROSE IN LACTATED RINGERS 5 % IV SOLN
INTRAVENOUS | Status: DC
  Administered 2015-06-24: 04:00:00 via INTRAVENOUS

## 2015-06-23 MED ORDER — FENTANYL CITRATE (PF) 100 MCG/2ML IJ SOLN
INTRAMUSCULAR | Status: AC
  Filled 2015-06-23: qty 2

## 2015-06-23 MED ORDER — OXYTOCIN 10 UNIT/ML IJ SOLN: 2.5000 [IU]/h | INTRAMUSCULAR | Status: AC

## 2015-06-23 MED ORDER — NALBUPHINE HCL 10 MG/ML IJ SOLN: 5.0000 mg | Freq: Once | INTRAMUSCULAR | Status: DC | PRN

## 2015-06-23 MED ORDER — PHENYLEPHRINE 8 MG IN D5W 100 ML (0.08MG/ML) PREMIX OPTIME
INJECTION | INTRAVENOUS | Status: AC
  Filled 2015-06-23: qty 100

## 2015-06-23 MED ORDER — MEDROXYPROGESTERONE ACETATE 150 MG/ML IM SUSP: 150.0000 mg | INTRAMUSCULAR | Status: DC | PRN

## 2015-06-23 MED ORDER — MENTHOL 3 MG MT LOZG: 1.0000 | LOZENGE | OROMUCOSAL | Status: DC | PRN

## 2015-06-23 MED ORDER — VENLAFAXINE HCL ER 75 MG PO CP24
75.0000 mg | ORAL_CAPSULE | Freq: Every day | ORAL | Status: DC
  Administered 2015-06-24 – 2015-06-27 (×4): 75 mg via ORAL
  Filled 2015-06-23 (×5): qty 1

## 2015-06-23 MED ORDER — NALOXONE HCL 2 MG/2ML IJ SOSY: 1.0000 ug/kg/h | PREFILLED_SYRINGE | INTRAMUSCULAR | Status: DC | PRN

## 2015-06-23 MED ORDER — MEASLES, MUMPS & RUBELLA VAC ~~LOC~~ INJ: 0.5000 mL | INJECTION | Freq: Once | SUBCUTANEOUS | Status: DC

## 2015-06-23 MED ORDER — MORPHINE SULFATE (PF) 0.5 MG/ML IJ SOLN
INTRAMUSCULAR | Status: DC | PRN
  Administered 2015-06-23: .2 mg via INTRATHECAL

## 2015-06-23 MED ORDER — NALBUPHINE HCL 10 MG/ML IJ SOLN: 5.0000 mg | INTRAMUSCULAR | Status: DC | PRN

## 2015-06-23 MED ORDER — CEFAZOLIN SODIUM-DEXTROSE 2-3 GM-% IV SOLR
INTRAVENOUS | Status: AC
  Filled 2015-06-23: qty 50

## 2015-06-23 MED ORDER — ZOLPIDEM TARTRATE 5 MG PO TABS: 5.0000 mg | ORAL_TABLET | Freq: Every evening | ORAL | Status: DC | PRN

## 2015-06-23 MED ORDER — SCOPOLAMINE 1 MG/3DAYS TD PT72: 1.0000 | MEDICATED_PATCH | Freq: Once | TRANSDERMAL | Status: DC

## 2015-06-23 MED ORDER — LACTATED RINGERS IV SOLN
INTRAVENOUS | Status: DC | PRN
  Administered 2015-06-23 (×3): via INTRAVENOUS

## 2015-06-23 MED ORDER — SCOPOLAMINE 1 MG/3DAYS TD PT72
MEDICATED_PATCH | TRANSDERMAL | Status: AC
  Filled 2015-06-23: qty 1

## 2015-06-23 MED ORDER — LEVOTHYROXINE SODIUM 25 MCG PO TABS
25.0000 ug | ORAL_TABLET | Freq: Every day | ORAL | Status: DC
  Administered 2015-06-24 – 2015-06-27 (×4): 25 ug via ORAL
  Filled 2015-06-23 (×5): qty 1

## 2015-06-23 MED ORDER — OXYCODONE HCL 5 MG PO TABS
5.0000 mg | ORAL_TABLET | ORAL | Status: DC | PRN
  Administered 2015-06-24: 5 mg via ORAL
  Filled 2015-06-23: qty 1

## 2015-06-23 MED ORDER — ACETAMINOPHEN 500 MG PO TABS
1000.0000 mg | ORAL_TABLET | Freq: Four times a day (QID) | ORAL | Status: AC
  Administered 2015-06-24 (×3): 1000 mg via ORAL
  Filled 2015-06-23 (×5): qty 2

## 2015-06-23 MED ORDER — OXYCODONE HCL 5 MG PO TABS
10.0000 mg | ORAL_TABLET | ORAL | Status: DC | PRN
  Administered 2015-06-25 – 2015-06-27 (×6): 10 mg via ORAL
  Filled 2015-06-23 (×6): qty 2

## 2015-06-23 MED ORDER — SIMETHICONE 80 MG PO CHEW: 80.0000 mg | CHEWABLE_TABLET | ORAL | Status: DC | PRN

## 2015-06-23 MED ORDER — ACETAMINOPHEN 10 MG/ML IV SOLN
1000.0000 mg | Freq: Once | INTRAVENOUS | Status: AC
  Administered 2015-06-23: 1000 mg via INTRAVENOUS
  Filled 2015-06-23: qty 100

## 2015-06-23 MED ORDER — BUPIVACAINE IN DEXTROSE 0.75-8.25 % IT SOLN
INTRATHECAL | Status: DC | PRN
  Administered 2015-06-23: 1.4 mL via INTRATHECAL

## 2015-06-23 MED ORDER — LANOLIN HYDROUS EX OINT: 1.0000 "application " | TOPICAL_OINTMENT | CUTANEOUS | Status: DC | PRN

## 2015-06-23 MED ORDER — SIMETHICONE 80 MG PO CHEW
80.0000 mg | CHEWABLE_TABLET | ORAL | Status: DC
  Administered 2015-06-23 – 2015-06-26 (×4): 80 mg via ORAL
  Filled 2015-06-23 (×4): qty 1

## 2015-06-23 MED ORDER — LORATADINE 10 MG PO TABS
10.0000 mg | ORAL_TABLET | Freq: Every day | ORAL | Status: DC
  Administered 2015-06-24 – 2015-06-27 (×4): 10 mg via ORAL
  Filled 2015-06-23 (×5): qty 1

## 2015-06-23 MED ORDER — DIPHENHYDRAMINE HCL 25 MG PO CAPS
25.0000 mg | ORAL_CAPSULE | ORAL | Status: DC | PRN
  Administered 2015-06-24: 25 mg via ORAL
  Filled 2015-06-23: qty 1

## 2015-06-23 MED ORDER — OXYTOCIN 10 UNIT/ML IJ SOLN
40.0000 [IU] | INTRAVENOUS | Status: DC | PRN
  Administered 2015-06-23: 40 [IU] via INTRAVENOUS

## 2015-06-23 MED ORDER — WITCH HAZEL-GLYCERIN EX PADS: 1.0000 "application " | MEDICATED_PAD | CUTANEOUS | Status: DC | PRN

## 2015-06-23 MED ORDER — PHENYLEPHRINE 8 MG IN D5W 100 ML (0.08MG/ML) PREMIX OPTIME
INJECTION | INTRAVENOUS | Status: DC | PRN
  Administered 2015-06-23: 60 ug/min via INTRAVENOUS

## 2015-06-23 MED ORDER — FENTANYL CITRATE (PF) 100 MCG/2ML IJ SOLN
INTRAMUSCULAR | Status: DC | PRN
  Administered 2015-06-23: 100 ug via INTRAVENOUS

## 2015-06-23 MED ORDER — ONDANSETRON HCL 4 MG/2ML IJ SOLN
INTRAMUSCULAR | Status: DC | PRN
  Administered 2015-06-23: 4 mg via INTRAVENOUS

## 2015-06-23 MED ORDER — SENNOSIDES-DOCUSATE SODIUM 8.6-50 MG PO TABS
2.0000 | ORAL_TABLET | ORAL | Status: DC
Start: 2015-06-24 — End: 2015-06-27
  Administered 2015-06-23 – 2015-06-26 (×4): 2 via ORAL
  Filled 2015-06-23 (×4): qty 2

## 2015-06-23 MED ORDER — CEFAZOLIN SODIUM-DEXTROSE 2-3 GM-% IV SOLR
INTRAVENOUS | Status: DC | PRN
  Administered 2015-06-23: 2 g via INTRAVENOUS

## 2015-06-23 MED ORDER — LACTATED RINGERS IV SOLN
INTRAVENOUS | Status: DC | PRN
  Administered 2015-06-23 (×2): via INTRAVENOUS

## 2015-06-23 MED ORDER — MONTELUKAST SODIUM 10 MG PO TABS
10.0000 mg | ORAL_TABLET | Freq: Every day | ORAL | Status: DC
  Administered 2015-06-23 – 2015-06-26 (×4): 10 mg via ORAL
  Filled 2015-06-23 (×5): qty 1

## 2015-06-23 MED ORDER — DIBUCAINE 1 % RE OINT: 1.0000 "application " | TOPICAL_OINTMENT | RECTAL | Status: DC | PRN

## 2015-06-23 MED ORDER — NALOXONE HCL 0.4 MG/ML IJ SOLN: 0.4000 mg | INTRAMUSCULAR | Status: DC | PRN

## 2015-06-23 MED ORDER — ACETAMINOPHEN 325 MG PO TABS
650.0000 mg | ORAL_TABLET | ORAL | Status: DC | PRN
  Administered 2015-06-25: 650 mg via ORAL
  Filled 2015-06-23: qty 2

## 2015-06-23 MED ORDER — MEPERIDINE HCL 25 MG/ML IJ SOLN
INTRAMUSCULAR | Status: AC
  Administered 2015-06-23: 25 mg
  Filled 2015-06-23: qty 1

## 2015-06-23 MED ORDER — MORPHINE SULFATE (PF) 0.5 MG/ML IJ SOLN
INTRAMUSCULAR | Status: AC
  Filled 2015-06-23: qty 10

## 2015-06-23 MED ORDER — DIPHENHYDRAMINE HCL 25 MG PO CAPS: 25.0000 mg | ORAL_CAPSULE | Freq: Four times a day (QID) | ORAL | Status: DC | PRN

## 2015-06-23 MED ORDER — OXYTOCIN 10 UNIT/ML IJ SOLN
INTRAMUSCULAR | Status: AC
  Filled 2015-06-23: qty 4

## 2015-06-23 MED ORDER — SODIUM CHLORIDE 0.9% FLUSH: 3.0000 mL | INTRAVENOUS | Status: DC | PRN

## 2015-06-23 MED ORDER — TETANUS-DIPHTH-ACELL PERTUSSIS 5-2.5-18.5 LF-MCG/0.5 IM SUSP: 0.5000 mL | Freq: Once | INTRAMUSCULAR | Status: DC

## 2015-06-23 MED ORDER — DIPHENHYDRAMINE HCL 50 MG/ML IJ SOLN: 12.5000 mg | INTRAMUSCULAR | Status: DC | PRN

## 2015-06-23 MED ORDER — SCOPOLAMINE 1 MG/3DAYS TD PT72
MEDICATED_PATCH | TRANSDERMAL | Status: DC | PRN
  Administered 2015-06-23: 1 via TRANSDERMAL

## 2015-06-23 SURGICAL SUPPLY — 32 items
CLAMP CORD UMBIL (MISCELLANEOUS) IMPLANT
CLOTH BEACON ORANGE TIMEOUT ST (SAFETY) ×2 IMPLANT
DRSG OPSITE POSTOP 4X10 (GAUZE/BANDAGES/DRESSINGS) ×2 IMPLANT
DRSG OPSITE POSTOP 4X12 (GAUZE/BANDAGES/DRESSINGS) ×2 IMPLANT
DURAPREP 26ML APPLICATOR (WOUND CARE) ×2 IMPLANT
ELECT REM PT RETURN 9FT ADLT (ELECTROSURGICAL) ×2
ELECTRODE REM PT RTRN 9FT ADLT (ELECTROSURGICAL) ×1 IMPLANT
EXTRACTOR VACUUM M CUP 4 TUBE (SUCTIONS) IMPLANT
GLOVE BIO SURGEON STRL SZ 6.5 (GLOVE) ×2 IMPLANT
GLOVE BIOGEL PI IND STRL 7.0 (GLOVE) ×2 IMPLANT
GLOVE BIOGEL PI INDICATOR 7.0 (GLOVE) ×2
GOWN STRL REUS W/TWL LRG LVL3 (GOWN DISPOSABLE) ×4 IMPLANT
KIT ABG SYR 3ML LUER SLIP (SYRINGE) IMPLANT
LIQUID BAND (GAUZE/BANDAGES/DRESSINGS) ×2 IMPLANT
NEEDLE HYPO 25X5/8 SAFETYGLIDE (NEEDLE) IMPLANT
NS IRRIG 1000ML POUR BTL (IV SOLUTION) ×2 IMPLANT
PACK C SECTION WH (CUSTOM PROCEDURE TRAY) ×2 IMPLANT
PAD ABD 7.5X8 STRL (GAUZE/BANDAGES/DRESSINGS) ×2 IMPLANT
PAD OB MATERNITY 4.3X12.25 (PERSONAL CARE ITEMS) ×2 IMPLANT
PENCIL SMOKE EVAC W/HOLSTER (ELECTROSURGICAL) ×2 IMPLANT
SPONGE GAUZE 4X4 12PLY STER LF (GAUZE/BANDAGES/DRESSINGS) ×2 IMPLANT
SUT CHROMIC 0 CT 1 (SUTURE) ×2 IMPLANT
SUT CHROMIC 0 CT 802H (SUTURE) IMPLANT
SUT CHROMIC 0 CTX 36 (SUTURE) ×6 IMPLANT
SUT MON AB-0 CT1 36 (SUTURE) ×2 IMPLANT
SUT PDS AB 0 CTX 60 (SUTURE) ×2 IMPLANT
SUT PLAIN 0 NONE (SUTURE) IMPLANT
SUT PLAIN 2 0 XLH (SUTURE) ×2 IMPLANT
SUT VIC AB 4-0 KS 27 (SUTURE) IMPLANT
SYR BULB 3OZ (MISCELLANEOUS) ×2 IMPLANT
TOWEL OR 17X24 6PK STRL BLUE (TOWEL DISPOSABLE) ×2 IMPLANT
TRAY FOLEY CATH SILVER 14FR (SET/KITS/TRAYS/PACK) IMPLANT

## 2015-06-23 NOTE — Anesthesia Preprocedure Evaluation (Signed)
Anesthesia Evaluation  Patient identified by MRN, date of birth, ID band Patient awake  General Assessment Comment:Full meal 29min prior.  Reviewed: Allergy & Precautions, H&P , NPO status , Patient's Chart, lab work & pertinent test results  Airway Mallampati: III  TM Distance: >3 FB Neck ROM: full    Dental no notable dental hx.    Pulmonary former smoker,    Pulmonary exam normal        Cardiovascular negative cardio ROS Normal cardiovascular exam     Neuro/Psych    GI/Hepatic negative GI ROS, Neg liver ROS,   Endo/Other  Morbid obesity  Renal/GU negative Renal ROS     Musculoskeletal   Abdominal (+) + obese,   Peds  Hematology negative hematology ROS (+)   Anesthesia Other Findings   Reproductive/Obstetrics (+) Pregnancy                             Anesthesia Physical Anesthesia Plan  ASA: III and emergent  Anesthesia Plan: General/Spinal   Post-op Pain Management:    Induction:   Airway Management Planned:   Additional Equipment:   Intra-op Plan:   Post-operative Plan:   Informed Consent: I have reviewed the patients History and Physical, chart, labs and discussed the procedure including the risks, benefits and alternatives for the proposed anesthesia with the patient or authorized representative who has indicated his/her understanding and acceptance.     Plan Discussed with: CRNA and Surgeon  Anesthesia Plan Comments: (Because of great team work we were able to sit the patient up while assessing FHR by cord palpation and place a spinal.)        Anesthesia Quick Evaluation

## 2015-06-23 NOTE — Progress Notes (Signed)
No complaints  AF, VSS  + FHT Abd gravid, NT Ext - NT, no edema  A/P:  PPROM, 29+6 wks Continue latency abx S/p BMZ Exp mngt, vtx on last [place]

## 2015-06-23 NOTE — Op Note (Addendum)
Cesarean Section Procedure Note   [name] [name]  06/19/2015 - 06/23/2015  Indications: cord prolapse   Pre-operative Diagnosis: PPROM, cord prolapse.   Post-operative Diagnosis: Same   Procedure:  High transverse c-section  Surgeon: Surgeon(s) and Role:    * Marylynn Pearson, MD - Primary       Assistants: Emeterio Reeve, MD  Anesthesia: spinal   Procedure Details:   Pt was up to BR and noted cord prolapse - RN called and vaginal exam done to lift fetal vertex.  Upon my arrival to OR, spinal & foley placed. Time out done and Pt identified as [name] [name] and the procedure verified as C-Section Delivery.After induction of anesthesia, the patient was draped and prepped in the usual sterile manner. A transverse was made and carried down through the subcutaneous tissue to the fascia. Fascial incision was made and extended transversely. The fascia was separated from the underlying rectus tissue superiorly and inferiorly. The peritoneum was identified and entered. Peritoneal incision was extended longitudinally. The Lower uterine segment was not well developed. A high transverse uterine incision was made. Delivered from cephalic presentation was a viable female infant.  Infant taken to NICU team.  Cord ph was not sent the umbilical cord was clamped and cut cord blood was obtained for evaluation. The placenta was removed Intact and appeared normal. The uterine outline, tubes and ovaries appeared normal. The uterine incision was closed with running locked sutures of 0Vicryl.  Hemostasis was observed. Lavage was carried out until clear.  Peritoneum was closed with vicryl.  The fascia was then reapproximated with running sutures of 0PDS. The subcuticular closure was performed using 2-0plain gut. The skin was closed with 4-0Vicryl.   Instrument, sponge, and needle counts were correct prior the abdominal closure and were correct at the conclusion of the case.     Estimated Blood Loss:    Urine  Output: clear  Specimens: placenta to pathology  Complications: no complications  Disposition: PACU - hemodynamically stable.   Maternal Condition: stable   Baby condition / location:  NICU  Attending Attestation: I was present and scrubbed for the entire procedure.   Signed: Marylynn Pearson, MD

## 2015-06-23 NOTE — Anesthesia Postprocedure Evaluation (Signed)
Anesthesia Post Note  Patient: [name] [name]  Procedure(s) Performed: Procedure(s) (LRB): CESAREAN SECTION (N/A)  Patient location during evaluation: PACU Anesthesia Type: Spinal Level of consciousness: awake Pain management: pain level controlled Vital Signs Assessment: post-procedure vital signs reviewed and stable Respiratory status: spontaneous breathing Cardiovascular status: stable Postop Assessment: no headache, no backache, spinal receding, patient able to bend at knees and no signs of nausea or vomiting Anesthetic complications: no    Last Vitals:  Filed Vitals:   06/23/15 2145 06/23/15 2205  BP: 135/96   Pulse: 96   Temp: 36.9 C 36.5 C  Resp: 21     Last Pain:  Filed Vitals:   06/23/15 2207  PainSc: 0-No pain                 [name],[name]

## 2015-06-23 NOTE — Transfer of Care (Signed)
Immediate Anesthesia Transfer of Care Note  Patient: [name] [name]  Procedure(s) Performed: Procedure(s): CESAREAN SECTION (N/A)  Patient Location: PACU  Anesthesia Type:Spinal  Level of Consciousness: awake, alert , oriented and patient cooperative  Airway & Oxygen Therapy: Patient Spontanous Breathing  Post-op Assessment: Report given to RN and Post -op Vital signs reviewed and stable  Post vital signs: Reviewed and stable  Last Vitals:  TEMP 98.2 BP 104/69 HR 94 RR 21 POX [id number]  Complications: No apparent anesthesia complications

## 2015-06-23 NOTE — Anesthesia Procedure Notes (Signed)
Spinal Patient location during procedure: OR Start time: 06/23/2015 8:00 PM End time: 06/23/2015 8:04 PM Staffing Anesthesiologist: Lyn Hollingshead Performed by: anesthesiologist  Preanesthetic Checklist Completed: patient identified, surgical consent, pre-op evaluation, timeout performed, IV checked, risks and benefits discussed and monitors and equipment checked Spinal Block Patient position: sitting Prep: site prepped and draped and DuraPrep Patient monitoring: heart rate, cardiac monitor, continuous pulse ox and blood pressure Approach: midline Location: L3-4 Injection technique: single-shot Needle Needle type: Sprotte and Tuohy  Needle gauge: 24 G Needle length: 9 cm Needle insertion depth: 8 cm Catheter type: closed end flexible Catheter size: 19 g Assessment Sensory level: T4 Additional Notes Did not place an epidural catheter just used the Tuohy as an introducer given the BMI of 50. FHR assessed throughout procedure and except for a brief period where it was 80, no other FHR < 100

## 2015-06-24 ENCOUNTER — Encounter (HOSPITAL_COMMUNITY): Payer: Self-pay | Admitting: Obstetrics and Gynecology

## 2015-06-24 LAB — CBC
HCT: 31.7 % — ABNORMAL LOW (ref 36.0–46.0)
HEMOGLOBIN: 10.3 g/dL — AB (ref 12.0–15.0)
MCH: 27.1 pg (ref 26.0–34.0)
MCHC: 32.5 g/dL (ref 30.0–36.0)
MCV: 83.4 fL (ref 78.0–100.0)
PLATELETS: 279 10*3/uL (ref 150–400)
RBC: 3.8 MIL/uL — AB (ref 3.87–5.11)
RDW: 13.9 % (ref 11.5–15.5)
WBC: 24.2 10*3/uL — ABNORMAL HIGH (ref 4.0–10.5)

## 2015-06-24 LAB — RPR: RPR: NONREACTIVE

## 2015-06-24 MED ORDER — RHO D IMMUNE GLOBULIN 1500 UNIT/2ML IJ SOSY
300.0000 ug | PREFILLED_SYRINGE | Freq: Once | INTRAMUSCULAR | Status: AC
  Administered 2015-06-24: 300 ug via INTRAVENOUS
  Filled 2015-06-24: qty 2

## 2015-06-24 NOTE — Progress Notes (Signed)
CLINICAL SOCIAL WORK MATERNAL/CHILD NOTE  Patient Details  Name: [name] [name] MRN: [id number] Date of Birth: 06/23/2014  Date:  06/24/2015  Clinical Social Worker Initiating Note:  Alyce Inscore E. 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  FOB works for [employer].)   Education:      Museum/gallery curator Resources:  [employer]   Other Resources:      Cultural/Religious Considerations Which May Impact Care: None stated.  Strengths:  Compliance with medical plan , Understanding of illness, Ability to meet basic needs    Risk Factors/Current Problems:  Mental Health Concerns  (MOB reports dx of Anxiety Disorder which developed after her grandmother died three years ago.  )   Cognitive State:  Alert , Linear Thinking , Goal Oriented , Insightful    Mood/Affect:  Interested , Comfortable , Calm    CSW Assessment: CSW met with parents at baby's bedside to introduce services, offer support and complete assessment due to baby's admission to NICU at 30 weeks.  Parents were pleasant, welcoming and easy to engage.  They report baby is doing well. Parents report that they were married in September of last year and have been together for 2 years.  This is their first child.  MOB reports that they were planning on trying to get pregnant soon after the wedding and that conception happened sooner than they  anticipated.  They both seem very happy about baby as they held her hands and looked at her lovingly as they spoke.  MOB states she is from [facility] and moved here after the wedding.  FOB is from [place] and has family here.  MOB states her God-sister lives locally and is a good support.  Her family is spread out, but sound supportive.  MOB reports that she and FOB met through a mutual friend.   MOB reports that baby "surprised [place] last night."  She shared her birth story which started with a cod prolapse after going to the bathroom last night in her Antenatal room.  She states everything had gone well with her pregnancy until her water broke on Sunday.  We discussed how uncertain pregnancy is and how it is out of our control.  MOB told CSW that everyone jumped in to action very quickly and were great in keeping them calm.  She acknowledges some anxiety and panic during this situation and informed CSW that has an anxiety disorder.  CSW normalized anxiety and panic in the situation she was in last night and discussed how she may have experience panic and anxiety, or other emotions, in the future related to her birth experience.  MOB states she had a therapist about 2 years ago, which she found very beneficial.  CSW offered counseling resources in this area if she is interested at any time.  MOB was appreciative.  She states she is well controlled on Effexor.  CSW provided education regarding signs and symptoms of PPD and stressed the importance of talking with CSW and or her doctor if she has concerns about her mental health at any time.  CSW also discussed common emotions and the lability of emotions in the first few weeks after delivery while hormones return to pre-pregnancy levels.  Both parents state emotional lability is common for MOB anyway.  CSW encouraged them to allow themselves to be emotional, which watching for any concerning symptoms. Parents report feeling comfortable with baby's care and the information  they have received.  CSW encouraged them to take this experience one day at a time, trying not to place expectations on baby's discharge.  MOB replied, "she will determine when she's ready."  CSW agreed and asked that she keep this in mind every day.  CSW encouraged them to focus on their daughter, rather than her surroundings, and remember that this situation is temporary and necessary.  Parents seemed appreciative of the discussion with CSW.   CSW explained ongoing support services offered by NICU and the benefits of processing emotions in a potentially stressful experience that came unexpectedly.  CSW provided contact information.  Parents thanked CSW for the visit.  CSW Plan/Description:  Patient/Family Education , Psychosocial Support and Ongoing Assessment of Needs    Alphonzo Cruise, [place] 06/24/2015, 3:06 PM

## 2015-06-24 NOTE — Addendum Note (Signed)
Addendum  created 06/24/15 1619 by Jonna Munro, CRNA   Modules edited: Clinical Notes   Clinical Notes:  File: [id number]

## 2015-06-24 NOTE — Anesthesia Postprocedure Evaluation (Signed)
Anesthesia Post Note  Patient: [name] [name]  Procedure(s) Performed: Procedure(s) (LRB): CESAREAN SECTION (N/A)  Patient location during evaluation: [facility] Unit Anesthesia Type: Spinal Level of consciousness: awake and alert and oriented Pain management: satisfactory to patient Vital Signs Assessment: post-procedure vital signs reviewed and stable Respiratory status: spontaneous breathing, nonlabored ventilation and respiratory function stable Cardiovascular status: stable Postop Assessment: no headache, no backache, patient able to bend at knees, no signs of nausea or vomiting and adequate PO intake Anesthetic complications: no    Last Vitals:  Filed Vitals:   06/24/15 1000 06/24/15 1410  BP: 128/89 121/77  Pulse: 93 89  Temp: 36.8 C 36.5 C  Resp: 18 18    Last Pain:  Filed Vitals:   06/24/15 1411  PainSc: 1                  Shota Kohrs

## 2015-06-24 NOTE — Progress Notes (Signed)
Subjective: Postpartum Day 1: Cesarean Delivery Patient in NICU, states baby is having UAC placed.    Objective: Vital signs in last 24 hours: Temp:  [97.7 F (36.5 C)-99.1 F (37.3 C)] 99.1 F (37.3 C) (02/17 0730) Pulse Rate:  [90-112] 90 (02/17 0730) Resp:  [18-23] 18 (02/17 0730) BP: (104-144)/(50-96) 126/81 mmHg (02/17 0730) SpO2:  [95 %-100 %] 97 % (02/17 0730)  Physical Exam:   Patient not examined  }   Recent Labs  06/24/15 0539  HGB 10.3*  HCT 31.7*    Assessment/Plan: Status post Cesarean section. Doing well postoperatively.  Continue current care.plan cbc in am  CURTIS,CAROL G 06/24/2015, 8:39 AM

## 2015-06-24 NOTE — Lactation Note (Addendum)
This note was copied from a baby's chart. Lactation Consultation Note  Initial visit done.  Providing Breastmilk For Your Baby in NICU given to patient. Mom has been pumping every 3 hours and obtaining 30 mls.  Following pumping with hand expression.  Mom has a [place] for home use after discharge.  No questions or concerns at present.  Encouraged to call with concerns or questions.  Patient Name: [name] [name] Adie [id number] Date: 06/24/2015 Reason for consult: Initial assessment;NICU baby   Maternal Data    Feeding    LATCH Score/Interventions                      Lactation Tools Discussed/Used Pump Review: Setup, frequency, and cleaning;Milk Storage Initiated by:: RN Date initiated:: 06/23/15   Consult Status Consult Status: Follow-up    Ave Filter 06/24/2015, 10:00 AM

## 2015-06-25 LAB — CBC
HCT: 30.2 % — ABNORMAL LOW (ref 36.0–46.0)
HEMOGLOBIN: 9.9 g/dL — AB (ref 12.0–15.0)
MCH: 27.4 pg (ref 26.0–34.0)
MCHC: 32.8 g/dL (ref 30.0–36.0)
MCV: 83.7 fL (ref 78.0–100.0)
Platelets: 277 10*3/uL (ref 150–400)
RBC: 3.61 MIL/uL — ABNORMAL LOW (ref 3.87–5.11)
RDW: 14 % (ref 11.5–15.5)
WBC: 18 10*3/uL — AB (ref 4.0–10.5)

## 2015-06-25 LAB — RH IG WORKUP (INCLUDES ABO/RH)
ABO/RH(D): O NEG
Fetal Screen: NEGATIVE
GESTATIONAL AGE(WKS): 30
Unit division: 0

## 2015-06-25 NOTE — Progress Notes (Signed)
Subjective: Postpartum Day 2: Cesarean Delivery Patient reports incisional pain, tolerating PO, + flatus, + BM and no problems voiding.    Objective: Vital signs in last 24 hours: Temp:  [97.7 F (36.5 C)-97.9 F (36.6 C)] 97.9 F (36.6 C) (02/18 0535) Pulse Rate:  [89-104] 104 (02/18 0535) Resp:  [16-18] 16 (02/18 0535) BP: (121-147)/(72-77) 127/77 mmHg (02/18 0535) SpO2:  [97 %-100 %] 100 % (02/18 0535)  Physical Exam:  General: alert, cooperative, appears stated age and no distress Lochia: appropriate Uterine Fundus: firm Incision: healing well DVT Evaluation: No evidence of DVT seen on physical exam.   Recent Labs  06/24/15 0539 06/25/15 0529  HGB 10.3* 9.9*  HCT 31.7* 30.2*    Assessment/Plan: Status post Cesarean section. Doing well postoperatively.  Baby well in NICU.  Zaryah Seckel C 06/25/2015, 10:22 AM

## 2015-06-25 NOTE — Lactation Note (Signed)
This note was copied from a baby's chart. Lactation Consultation Note  Patient Name: [name] [name] [id number] Date: 06/25/2015 Reason for consult: Follow-up assessment; NICU infant Infant is 56 hours old & mom was seen by [facility] for follow-up assessment. Mom had just come back from the NICU & was about to pump. Pt reports pumping q 3-4 hrs (except last night). Pt stated she did get ~102mL at first but is now only getting drops & this morning she didn't get anything. Reinforced importance of pumping q 3hrs even if she doesn't see any milk. Mom had questions about her medela pump 'n style advance that she received from insurance so mom set it up & we discussed how to use it. Mom went ahead & tried pumping with her personal pump for ~15 mins- only a few drops were seen (slightly more from her left breast compared to her right). After pumping, encouraged mom to do some hand expression; mom needed a little coaching on proper technique but was able to get more milk from her left breast compared to with the pump but none from her right breast. Used colostrum container to collect the milk she got from her left breast. Encouraged mom to continue using the symphony pump while she is in the hospital q 3hrs on Initiate setting until she sees more volume (with breast massage before pumping & hand expression after pumping). Mom reports no further questions. Encouraged mom to ask for [facility] if she has questions later.  Maternal Data    Feeding Feeding Type: Breast Milk  LATCH Score/Interventions                      Lactation Tools Discussed/Used     Consult Status Consult Status: Follow-up Date: 06/26/15 Follow-up type: In-patient    [name] 06/25/2015, 1:57 PM

## 2015-06-26 NOTE — Progress Notes (Signed)
Subjective: Postpartum Day 3: Cesarean Delivery Patient reports incisional pain, tolerating PO, + flatus and no problems voiding.  Some weakness with walking and standing.  Objective: Vital signs in last 24 hours: Temp:  [97.7 F (36.5 C)-98.1 F (36.7 C)] 97.7 F (36.5 C) (02/19 0525) Pulse Rate:  [97-108] 97 (02/19 0525) Resp:  [18] 18 (02/19 0525) BP: (123-127)/(59-92) 125/78 mmHg (02/19 0525) SpO2:  [99 %-100 %] 99 % (02/19 0525)  Physical Exam:  General: alert, cooperative, appears stated age and no distress Lochia: appropriate Uterine Fundus: firm Incision: healing well DVT Evaluation: No evidence of DVT seen on physical exam.   Recent Labs  06/24/15 0539 06/25/15 0529  HGB 10.3* 9.9*  HCT 31.7* 30.2*    Assessment/Plan: Status post Cesarean section. Postoperative course complicated by anemia  Begin FeSo4.  Baby doing well in NICU.  Plan d/c tomorrow.  Zackory Pudlo C 06/26/2015, 9:45 AM

## 2015-06-26 NOTE — Lactation Note (Signed)
This note was copied from a baby's chart. Lactation Consultation Note; Mom reports breasts are feeling fuller this morning and she obtained more milk at last pumping. Reviewed importance of consistent pumping q 3 hours to prevent engorgement. Has Medela pump for home. No questions at present. Will follow up in NICU.   Patient Name: [name] [name] [id number] Date: 06/26/2015 Reason for consult: Follow-up assessment;NICU baby   Maternal Data Formula Feeding for Exclusion: Yes Reason for exclusion: Mother's choice to formula feed on admision Has patient been taught Hand Expression?: Yes Does the patient have breastfeeding experience prior to this delivery?: No  Feeding Feeding Type: Donor Breast Milk Length of feed: 30 min  LATCH Score/Interventions                      Lactation Tools Discussed/Used     Consult Status Consult Status: PRN    Truddie Crumble 06/26/2015, 2:00 PM

## 2015-06-27 MED ORDER — OXYCODONE HCL 5 MG PO TABS: 5.0000 mg | ORAL_TABLET | ORAL | Status: DC | PRN

## 2015-06-27 NOTE — Lactation Note (Signed)
This note was copied from a baby's chart. Lactation Consultation Note  Follow up visit made.  Mom is now pumping 30+ mls of milk and very pleased.  Encouraged to continue pumping regimen and to call with questions or concerns.  Patient Name: [name] [name] [id number] Date: 06/27/2015     Maternal Data    Feeding    LATCH Score/Interventions                      Lactation Tools Discussed/Used     Consult Status      Ave Filter 06/27/2015, 1:09 PM

## 2015-06-27 NOTE — Progress Notes (Signed)
Pt discharged to home with SO and mother.  Condition stable.  Pt ambulated to NICU with plans to leave hospital from there.  No equipment for home ordered at discharge.

## 2015-06-27 NOTE — Discharge Summary (Addendum)
Obstetric Discharge Summary Reason for Admission: PPROM Prenatal Procedures: ultrasound Intrapartum Procedures: high transverse c-section Postpartum Procedures: none Complications-Operative and Postpartum: none HEMOGLOBIN  Date Value Ref Range Status  06/25/2015 9.9* 12.0 - 15.0 g/dL Final   HCT  Date Value Ref Range Status  06/25/2015 30.2* 36.0 - 46.0 % Final    Physical Exam:  General: alert and cooperative Lochia: appropriate Uterine Fundus: firm Incision: healing well DVT Evaluation: No evidence of DVT seen on physical exam. Negative Homan's sign. No cords or calf tenderness. Calf/Ankle edema is present.  Discharge Diagnoses: s/p cesarean delivery @ 29 weeks, prolapsed cord  Discharge Information: Date: 06/27/2015 Activity: pelvic rest Diet: routine Medications: PNV, Percocet and synthroid and Effexor Condition: stable Instructions: refer to practice specific booklet Discharge to: home   Newborn Data: Live born female  Birth Weight: 3 lb 5.3 oz (1510 g) APGAR: 2, 5  Home with NICU.  CURTIS,CAROL G 06/27/2015, 8:39 AM

## 2015-07-01 ENCOUNTER — Ambulatory Visit: Payer: Self-pay

## 2015-07-01 NOTE — Lactation Note (Signed)
This note was copied from a baby's chart. Lactation Consultation Note  Patient Name: [name] [name] [id number] Date: 07/01/2015 Reason for consult: Follow-up assessment  With this mom of an 56 day old NICU baby, now 76 1/[redacted] weeks gestation. Mom put the baby to breast today, and she nuzzled well - mom pleased. Mom was concerned that wioth pumping, her nipples felt tight, so I gave her 27 flanges to try. Mom was also encouraged to pump until she stops dripping, followed by hand expression. Mom was still only pumping for 15 minutes at a time. Mom knows to call for lactation for questions/conerns.    Maternal Data    Feeding Feeding Type: Breast Milk Length of feed: 60 min  LATCH Score/Interventions                      Lactation Tools Discussed/Used     Consult Status Consult Status: PRN Follow-up type: In-patient (NICU)    Tonna Corner 07/01/2015, 1:44 PM

## 2015-07-07 ENCOUNTER — Ambulatory Visit: Payer: Self-pay

## 2015-07-07 NOTE — Lactation Note (Signed)
This note was copied from a baby's chart. Lactation Consultation Note  Follow up visit made in NICU.  Mom is now pumping 180-240 mls every 3-4 hours.  Denies concerns or questions.  Encouraged to call prn.  Patient Name: [name] [name] [id number] Date: 07/07/2015     Maternal Data    Feeding    LATCH Score/Interventions                      Lactation Tools Discussed/Used     Consult Status      Ave Filter 07/07/2015, 4:56 PM

## 2015-07-19 ENCOUNTER — Ambulatory Visit: Payer: Self-pay

## 2015-07-19 NOTE — Lactation Note (Signed)
This note was copied from a baby's chart. Lactation Consultation Note  Patient Name: Girl Eliya Wilt [id number] Date: 07/19/2015 Reason for consult: Follow-up assessment;Breast/nipple pain   Called to bedside to answer questions for mom. Mom reports she is feeling bilateral, intermittent, short duration, sharp pains in breast about an hour prior to pumping. She denies nipple pain/ flu like symptoms/ fever/ lumps/yeast to nipples/ or soreness to breast otherwise. In absence of other symptoms, I suspect it is from stretching of ductal tissue as breasts fill.   She reports she is pumping every 4 hours and getting about 6-7 ounces per pumping. Infant eats about 35-40 every 3 hours. Mom reports she has a freezer full and has backed off from 3 hours to 4 hours. Advised her to monitor supply and if supply decreases to increase pumpings again.  Enc mom to call with further questions/concerns.    Maternal Data Formula Feeding for Exclusion: No  Feeding Feeding Type: Breast Milk Length of feed: 90 min  LATCH Score/Interventions                      Lactation Tools Discussed/Used     Consult Status Consult Status: PRN Follow-up type: Call as needed    Donn Pierini 07/19/2015, 3:49 PM

## 2015-08-01 ENCOUNTER — Ambulatory Visit: Payer: Self-pay

## 2015-08-01 NOTE — Lactation Note (Signed)
This note was copied from a baby's chart. Lactation Consultation Note  Mom states milk supply is good.  No pumping concerns.  She is not sure she desires to put baby to breast.  Encouraged to call us with concerns prn.  Patient Name: [name] [name] [id number] Date: 08/01/2015     Maternal Data    Feeding Feeding Type: Breast Milk Nipple Type: Dr. Myra Gianotti Preemie Length of feed: 25 min  LATCH Score/Interventions                      Lactation Tools Discussed/Used     Consult Status      Ave Filter 08/01/2015, 4:42 PM

## 2016-11-14 NOTE — H&P (Signed)
[name] [name] is an 31 y.o. female G1P1 with a history of HTN and HA on OCP and Nuva Ring. Unsuccessful attempt at IUD insertion in office. Desires contraception.  Pertinent Gynecological History: Menses: flow is moderate Bleeding: N/A Contraception: none DES exposure: denies Blood transfusions: none Sexually transmitted diseases: no past history Previous GYN Procedures: none  Last mammogram: N/A Date: N/A Last pap: normal Date: 4/18 OB History: G1, P1   Menstrual History: Menarche age: unknown No LMP recorded.    Past Medical History:  Diagnosis Date  . Anxiety   . Carpal tunnel syndrome   . Environmental and seasonal allergies   . History of panic attacks   . Hypothyroid   . Obese   . Seasonal allergies     Past Surgical History:  Procedure Laterality Date  . CESAREAN SECTION N/A 06/23/2015   Procedure: CESAREAN SECTION;  Surgeon: Marylynn Pearson, MD;  Location: [facility];  Service: Obstetrics;  Laterality: N/A;  . CHOLECYSTECTOMY    . MINOR CARPAL TUNNEL      No family history on file.  Social History:  reports that she has quit smoking. Her smoking use included Cigarettes. She does not have any smokeless tobacco history on file. She reports that she does not drink alcohol or use drugs.  Allergies:  Allergies  Allergen Reactions  . Ibuprofen Hives  . Sulfa Antibiotics Hives    No prescriptions prior to admission.    ROS  unknown if currently breastfeeding. Physical Exam  Cardiovascular: Normal rate and regular rhythm.   Respiratory: Effort normal and breath sounds normal.  GI: Soft.    No results found for this or any previous visit (from the past 24 hour(s)).  No results found.  Assessment/Plan: 31 yo G1P1 morbidly obese patient who desires contraception Morbid obesity D/W H/S, D&C and IUD insertion Risks reviewed including infection, uterine perforation, organ damage, bleeding/transfusion-HIV/Hep, DVT/PE, pneumonia, failure of IUD and increased  ectopic risk. Patient states she understands andn agrees  [facility] II,Judieth Mckown E 11/14/2016, 1:43 PM

## 2016-11-20 NOTE — Patient Instructions (Addendum)
Your procedure is scheduled on:    Monday, July 30  Enter through the Main Entrance of [facility] at:  6 AM  Pick up the phone at the desk and dial [phone].  Call this number if you have problems the morning of surgery: [phone].  Remember: Do NOT eat food or drink clear liquids (including water) after Midnight Sunday  Take these medicines the morning of surgery with a SIP OF WATER:  Synthroid, effexor-xr and claritin  Bring albuterol inhaler with you on day of surgery.  Stop herbal medications and supplements at this time.  Do NOT wear jewelry (body piercing), metal hair clips/bobby pins, make-up, or nail polish. Do NOT wear lotions, powders, or perfumes.  You may wear deoderant. Do NOT shave for 48 hours prior to surgery. Do NOT bring valuables to the hospital. Contacts may not be worn into surgery.  Have a responsible adult drive you home and stay with you for 24 hours after your procedure.  Home with [name] cell [phone].

## 2016-11-22 ENCOUNTER — Encounter (HOSPITAL_COMMUNITY)
Admission: RE | Admit: 2016-11-22 | Discharge: 2016-11-22 | Disposition: A | Discharge status: Home / Self Care | Payer: BC Managed Care – PPO | Source: Ambulatory Visit | Attending: Obstetrics and Gynecology | Admitting: Obstetrics and Gynecology

## 2016-11-22 ENCOUNTER — Encounter (HOSPITAL_COMMUNITY): Payer: Self-pay | Admitting: *Deleted

## 2016-11-22 DIAGNOSIS — Z9889 Other specified postprocedural states: Secondary | ICD-10-CM | POA: Insufficient documentation

## 2016-11-22 DIAGNOSIS — I10 Essential (primary) hypertension: Secondary | ICD-10-CM | POA: Diagnosis not present

## 2016-11-22 DIAGNOSIS — F419 Anxiety disorder, unspecified: Secondary | ICD-10-CM | POA: Diagnosis not present

## 2016-11-22 DIAGNOSIS — Z888 Allergy status to other drugs, medicaments and biological substances status: Secondary | ICD-10-CM | POA: Insufficient documentation

## 2016-11-22 DIAGNOSIS — G5600 Carpal tunnel syndrome, unspecified upper limb: Secondary | ICD-10-CM | POA: Insufficient documentation

## 2016-11-22 DIAGNOSIS — Z01812 Encounter for preprocedural laboratory examination: Secondary | ICD-10-CM | POA: Diagnosis not present

## 2016-11-22 HISTORY — DX: Dyspnea, unspecified: R06.00

## 2016-11-22 LAB — CBC
HCT: 37 % (ref 36.0–46.0)
Hemoglobin: 12.3 g/dL (ref 12.0–15.0)
MCH: 26.9 pg (ref 26.0–34.0)
MCHC: 33.2 g/dL (ref 30.0–36.0)
MCV: 81 fL (ref 78.0–100.0)
PLATELETS: 313 10*3/uL (ref 150–400)
RBC: 4.57 MIL/uL (ref 3.87–5.11)
RDW: 13.9 % (ref 11.5–15.5)
WBC: 11 10*3/uL — AB (ref 4.0–10.5)

## 2016-11-22 LAB — TYPE AND SCREEN
ABO/RH(D): O NEG
Antibody Screen: NEGATIVE

## 2016-11-30 ENCOUNTER — Encounter (HOSPITAL_COMMUNITY): Payer: Self-pay | Admitting: Anesthesiology

## 2016-11-30 NOTE — H&P (Signed)
[name] [name] is an 31 y.o. female desires contraception. Unable to take OCP due to HA and hypertension. Unable to place IUD in office.  Pertinent Gynecological History: Menses: flow is moderate Bleeding: N/A Contraception: none DES exposure: denies Blood transfusions: none Sexually transmitted diseases: no past history Previous GYN Procedures: N/A  Last mammogram: N/A Date: Last pap: normal Date: 2018 OB History: G1, P1   Menstrual History: Menarche age: N/A Patient's last menstrual period was 11/10/2016 (approximate).    Past Medical History:  Diagnosis Date  . Anxiety   . Carpal tunnel syndrome    right hand  . Dyspnea    uses albuterol inhaler with patient gets sick  . Environmental and seasonal allergies   . History of panic attacks   . Hypothyroid   . Obese   . Seasonal allergies     Past Surgical History:  Procedure Laterality Date  . CESAREAN SECTION N/A 06/23/2015   Procedure: CESAREAN SECTION;  Surgeon: Marylynn Pearson, MD;  Location: [facility];  Service: Obstetrics;  Laterality: N/A;  . CHOLECYSTECTOMY    . MINOR CARPAL TUNNEL Left   . WISDOM TOOTH EXTRACTION      No family history on file.  Social History:  reports that she quit smoking about 5 years ago. Her smoking use included Cigarettes. She has a 5.00 pack-year smoking history. She has never used smokeless tobacco. She reports that she does not drink alcohol or use drugs.  Allergies:  Allergies  Allergen Reactions  . Ibuprofen Hives  . Sulfa Antibiotics Hives    No prescriptions prior to admission.    Review of Systems  Constitutional: Negative for fever.    Last menstrual period 11/10/2016, unknown if currently breastfeeding. Physical Exam  Cardiovascular: Normal rate and regular rhythm.   Respiratory: Effort normal and breath sounds normal.  GI: Soft. There is no tenderness.  Neurological: She has normal reflexes.    No results found for this or any previous visit (from the past 24  hour(s)).  No results found.  Assessment/Plan: 31 yo G1P1 who desire contraception, unable to tolerate hormonal contraception At risk for endometrial hyperplasia D/W patient H/S, D&C, placement of Mirena IUD D/W risks including infection, uterine perforation, organ damage, bleeding/transfusion-HIV/Hep, DVT/PE, pneumonia, contraception failure, IUD expulsion, pelvic pain, pain with intercourse. Patient states she understands and agrees  [facility] II,[name] E 11/30/2016, 2:37 PM

## 2016-12-02 NOTE — Anesthesia Preprocedure Evaluation (Addendum)
Anesthesia Evaluation  Patient identified by MRN, date of birth, ID band Patient awake    Reviewed: Allergy & Precautions, NPO status , Patient's Chart, lab work & pertinent test results  Airway Mallampati: II  TM Distance: >3 FB Neck ROM: Full    Dental no notable dental hx.    Pulmonary former smoker,    Pulmonary exam normal breath sounds clear to auscultation       Cardiovascular hypertension, Pt. on medications Normal cardiovascular exam Rhythm:Regular Rate:Normal     Neuro/Psych Anxiety negative neurological ROS     GI/Hepatic negative GI ROS, Neg liver ROS,   Endo/Other  Hypothyroidism Morbid obesity  Renal/GU negative Renal ROS     Musculoskeletal negative musculoskeletal ROS (+)   Abdominal (+) + obese,   Peds  Hematology negative hematology ROS (+)   Anesthesia Other Findings Super obese  hcg negative  Reproductive/Obstetrics                           Anesthesia Physical Anesthesia Plan  ASA: III  Anesthesia Plan: General   Post-op Pain Management:    Induction: Intravenous  PONV Risk Score and Plan: 3 and Ondansetron, Dexamethasone, Midazolam and Propofol infusion  Airway Management Planned: LMA  Additional Equipment:   Intra-op Plan:   Post-operative Plan: Extubation in OR  Informed Consent: I have reviewed the patients History and Physical, chart, labs and discussed the procedure including the risks, benefits and alternatives for the proposed anesthesia with the patient or authorized representative who has indicated his/her understanding and acceptance.   Dental advisory given  Plan Discussed with: CRNA  Anesthesia Plan Comments:        Anesthesia Quick Evaluation

## 2016-12-03 ENCOUNTER — Ambulatory Visit (HOSPITAL_COMMUNITY)
Admission: RE | Admit: 2016-12-03 | Discharge: 2016-12-03 | Disposition: A | Discharge status: Home / Self Care | Payer: BC Managed Care – PPO | Source: Ambulatory Visit | Attending: Obstetrics and Gynecology | Admitting: Obstetrics and Gynecology

## 2016-12-03 ENCOUNTER — Encounter (HOSPITAL_COMMUNITY): Payer: Self-pay

## 2016-12-03 ENCOUNTER — Ambulatory Visit (HOSPITAL_COMMUNITY): Payer: BC Managed Care – PPO | Admitting: Anesthesiology

## 2016-12-03 ENCOUNTER — Encounter (HOSPITAL_COMMUNITY)
Admission: RE | Disposition: A | Discharge status: Home / Self Care | Payer: Self-pay | Source: Ambulatory Visit | Attending: Obstetrics and Gynecology

## 2016-12-03 DIAGNOSIS — Z6841 Body Mass Index (BMI) 40.0 and over, adult: Secondary | ICD-10-CM | POA: Diagnosis not present

## 2016-12-03 DIAGNOSIS — Z9049 Acquired absence of other specified parts of digestive tract: Secondary | ICD-10-CM | POA: Insufficient documentation

## 2016-12-03 DIAGNOSIS — Z9889 Other specified postprocedural states: Secondary | ICD-10-CM | POA: Diagnosis not present

## 2016-12-03 DIAGNOSIS — N858 Other specified noninflammatory disorders of uterus: Secondary | ICD-10-CM | POA: Diagnosis not present

## 2016-12-03 DIAGNOSIS — Z30430 Encounter for insertion of intrauterine contraceptive device: Secondary | ICD-10-CM | POA: Insufficient documentation

## 2016-12-03 DIAGNOSIS — Z7951 Long term (current) use of inhaled steroids: Secondary | ICD-10-CM | POA: Diagnosis not present

## 2016-12-03 DIAGNOSIS — F419 Anxiety disorder, unspecified: Secondary | ICD-10-CM | POA: Insufficient documentation

## 2016-12-03 DIAGNOSIS — E039 Hypothyroidism, unspecified: Secondary | ICD-10-CM | POA: Diagnosis not present

## 2016-12-03 DIAGNOSIS — Z975 Presence of (intrauterine) contraceptive device: Secondary | ICD-10-CM

## 2016-12-03 DIAGNOSIS — Z882 Allergy status to sulfonamides status: Secondary | ICD-10-CM | POA: Insufficient documentation

## 2016-12-03 DIAGNOSIS — J302 Other seasonal allergic rhinitis: Secondary | ICD-10-CM | POA: Diagnosis not present

## 2016-12-03 DIAGNOSIS — Z886 Allergy status to analgesic agent status: Secondary | ICD-10-CM | POA: Diagnosis not present

## 2016-12-03 DIAGNOSIS — G5600 Carpal tunnel syndrome, unspecified upper limb: Secondary | ICD-10-CM | POA: Insufficient documentation

## 2016-12-03 DIAGNOSIS — Z79899 Other long term (current) drug therapy: Secondary | ICD-10-CM | POA: Insufficient documentation

## 2016-12-03 DIAGNOSIS — I10 Essential (primary) hypertension: Secondary | ICD-10-CM | POA: Insufficient documentation

## 2016-12-03 DIAGNOSIS — Z87891 Personal history of nicotine dependence: Secondary | ICD-10-CM | POA: Insufficient documentation

## 2016-12-03 HISTORY — PX: HYSTEROSCOPY WITH D & C: SHX1775

## 2016-12-03 HISTORY — PX: INTRAUTERINE DEVICE (IUD) INSERTION: SHX5877

## 2016-12-03 LAB — PREGNANCY, URINE: Preg Test, Ur: NEGATIVE

## 2016-12-03 SURGERY — DILATATION AND CURETTAGE /HYSTEROSCOPY
Anesthesia: General | Site: Vagina

## 2016-12-03 MED ORDER — PROPOFOL 10 MG/ML IV BOLUS
INTRAVENOUS | Status: DC | PRN
  Administered 2016-12-03: 200 mg via INTRAVENOUS

## 2016-12-03 MED ORDER — PROPOFOL 10 MG/ML IV BOLUS
INTRAVENOUS | Status: AC
  Filled 2016-12-03: qty 40

## 2016-12-03 MED ORDER — DEXAMETHASONE SODIUM PHOSPHATE 10 MG/ML IJ SOLN
INTRAMUSCULAR | Status: AC
  Filled 2016-12-03: qty 1

## 2016-12-03 MED ORDER — DEXAMETHASONE SODIUM PHOSPHATE 4 MG/ML IJ SOLN
INTRAMUSCULAR | Status: DC | PRN
  Administered 2016-12-03: 4 mg via INTRAVENOUS

## 2016-12-03 MED ORDER — ONDANSETRON HCL 4 MG/2ML IJ SOLN
INTRAMUSCULAR | Status: AC
  Filled 2016-12-03: qty 2

## 2016-12-03 MED ORDER — LIDOCAINE HCL (CARDIAC) 20 MG/ML IV SOLN
INTRAVENOUS | Status: DC | PRN
  Administered 2016-12-03: 50 mg via INTRAVENOUS

## 2016-12-03 MED ORDER — FENTANYL CITRATE (PF) 100 MCG/2ML IJ SOLN
INTRAMUSCULAR | Status: AC
  Filled 2016-12-03: qty 4

## 2016-12-03 MED ORDER — HYDROMORPHONE HCL 1 MG/ML IJ SOLN: 0.2500 mg | INTRAMUSCULAR | Status: DC | PRN

## 2016-12-03 MED ORDER — SODIUM CHLORIDE 0.9 % IR SOLN
Status: DC | PRN
  Administered 2016-12-03: 3000 mL

## 2016-12-03 MED ORDER — LIDOCAINE HCL (CARDIAC) 20 MG/ML IV SOLN
INTRAVENOUS | Status: AC
  Filled 2016-12-03: qty 5

## 2016-12-03 MED ORDER — LACTATED RINGERS IV SOLN
INTRAVENOUS | Status: DC
  Administered 2016-12-03: 125 mL/h via INTRAVENOUS
  Administered 2016-12-03: 08:00:00 via INTRAVENOUS

## 2016-12-03 MED ORDER — MIDAZOLAM HCL 2 MG/2ML IJ SOLN
INTRAMUSCULAR | Status: AC
  Filled 2016-12-03: qty 2

## 2016-12-03 MED ORDER — OXYCODONE HCL 5 MG/5ML PO SOLN: 5.0000 mg | Freq: Once | ORAL | Status: DC | PRN

## 2016-12-03 MED ORDER — DEXTROSE 5 % IV SOLN
3.0000 g | INTRAVENOUS | Status: AC
  Administered 2016-12-03: 3 g via INTRAVENOUS
  Filled 2016-12-03: qty 3000

## 2016-12-03 MED ORDER — SODIUM CHLORIDE 0.9 % IV SOLN: INTRAVENOUS | Status: DC | PRN

## 2016-12-03 MED ORDER — SCOPOLAMINE 1 MG/3DAYS TD PT72
MEDICATED_PATCH | TRANSDERMAL | Status: AC
  Administered 2016-12-03: 1.5 mg via TRANSDERMAL
  Filled 2016-12-03: qty 1

## 2016-12-03 MED ORDER — PROMETHAZINE HCL 25 MG/ML IJ SOLN: 6.2500 mg | INTRAMUSCULAR | Status: DC | PRN

## 2016-12-03 MED ORDER — LIDOCAINE HCL 1 % IJ SOLN
INTRAMUSCULAR | Status: AC
  Filled 2016-12-03: qty 20

## 2016-12-03 MED ORDER — GLYCOPYRROLATE 0.2 MG/ML IJ SOLN
INTRAMUSCULAR | Status: DC | PRN
  Administered 2016-12-03: 0.1 mg via INTRAVENOUS

## 2016-12-03 MED ORDER — LIDOCAINE HCL 1 % IJ SOLN
INTRAMUSCULAR | Status: DC | PRN
  Administered 2016-12-03: 20 mL

## 2016-12-03 MED ORDER — FENTANYL CITRATE (PF) 250 MCG/5ML IJ SOLN
INTRAMUSCULAR | Status: DC | PRN
  Administered 2016-12-03 (×4): 50 ug via INTRAVENOUS

## 2016-12-03 MED ORDER — MIDAZOLAM HCL 2 MG/2ML IJ SOLN
INTRAMUSCULAR | Status: DC | PRN
  Administered 2016-12-03 (×2): 1 mg via INTRAVENOUS

## 2016-12-03 MED ORDER — ONDANSETRON HCL 4 MG/2ML IJ SOLN
INTRAMUSCULAR | Status: DC | PRN
  Administered 2016-12-03: 4 mg via INTRAVENOUS

## 2016-12-03 MED ORDER — SCOPOLAMINE 1 MG/3DAYS TD PT72
1.0000 | MEDICATED_PATCH | Freq: Once | TRANSDERMAL | Status: DC
  Administered 2016-12-03: 1.5 mg via TRANSDERMAL

## 2016-12-03 MED ORDER — GLYCOPYRROLATE 0.2 MG/ML IJ SOLN
INTRAMUSCULAR | Status: AC
  Filled 2016-12-03: qty 1

## 2016-12-03 MED ORDER — PHENYLEPHRINE HCL 10 MG/ML IJ SOLN
INTRAMUSCULAR | Status: DC | PRN
  Administered 2016-12-03: 120 ug via INTRAVENOUS
  Administered 2016-12-03: 40 ug via INTRAVENOUS
  Administered 2016-12-03 (×2): 80 ug via INTRAVENOUS

## 2016-12-03 MED ORDER — OXYCODONE HCL 5 MG PO TABS: 5.0000 mg | ORAL_TABLET | Freq: Once | ORAL | Status: DC | PRN

## 2016-12-03 SURGICAL SUPPLY — 21 items
BIPOLAR CUTTING LOOP 21FR (ELECTRODE)
CANISTER SUCT 3000ML PPV (MISCELLANEOUS) ×2 IMPLANT
CATH ROBINSON RED A/P 16FR (CATHETERS) ×2 IMPLANT
CLOTH BEACON ORANGE TIMEOUT ST (SAFETY) ×2 IMPLANT
CONTAINER PREFILL 10% NBF 60ML (FORM) ×2 IMPLANT
ELECT COAG BIPOL BALL 21FR (ELECTRODE) IMPLANT
ELECT REM PT RETURN 9FT ADLT (ELECTROSURGICAL)
ELECTRODE REM PT RTRN 9FT ADLT (ELECTROSURGICAL) IMPLANT
GLOVE BIO SURGEON STRL SZ8 (GLOVE) ×4 IMPLANT
GLOVE BIOGEL PI IND STRL 7.0 (GLOVE) ×1 IMPLANT
GLOVE BIOGEL PI IND STRL 8 (GLOVE) ×1 IMPLANT
GLOVE BIOGEL PI INDICATOR 7.0 (GLOVE) ×1
GLOVE BIOGEL PI INDICATOR 8 (GLOVE) ×1
GOWN STRL REUS W/TWL LRG LVL3 (GOWN DISPOSABLE) ×4 IMPLANT
HOVERMATT HALF SINGLE USE (PATIENT TRANSFER) ×2 IMPLANT
LOOP CUTTING BIPOLAR 21FR (ELECTRODE) IMPLANT
PACK VAGINAL MINOR WOMEN LF (CUSTOM PROCEDURE TRAY) ×2 IMPLANT
PAD OB MATERNITY 4.3X12.25 (PERSONAL CARE ITEMS) ×2 IMPLANT
TOWEL OR 17X24 6PK STRL BLUE (TOWEL DISPOSABLE) ×4 IMPLANT
TUBING AQUILEX INFLOW (TUBING) ×2 IMPLANT
TUBING AQUILEX OUTFLOW (TUBING) ×2 IMPLANT

## 2016-12-03 NOTE — Op Note (Signed)
NAME:  [name] [name], [name] [name]             ACCOUNT NO.:  [id number]  MEDICAL RECORD NO.:  [id number]  LOCATION:                                FACILITY:  [facility]  PHYSICIAN:  Daleen Bo. Gaetano Net, M.D. DATE OF BIRTH:  02-12-1986  DATE OF PROCEDURE:  12/03/2016 DATE OF DISCHARGE:                              OPERATIVE REPORT   LOCATION:  [facility] [place], [place], [place].  PREOPERATIVE DIAGNOSES: 1. Desires contraception. 2. At risk for endometrial hyperplasia.  POSTOPERATIVE DIAGNOSES: 1. Desires contraception. 2. At risk for endometrial hyperplasia.  PROCEDURE:  Hysteroscopy, dilation, curettage, and insertion of Mirena IUD.  SURGEON:  Daleen Bo. Gaetano Net, M.D.  ANESTHESIA:  General with LMA.  ESTIMATED BLOOD LOSS:  Less than 50 mL.  SPECIMENS:  Endometrial curettings.  INDICATIONS AND CONSENT:  This patient is a 31 year old G83, P1, who desires permanent sterilization.  Her care has been complicated by hypertension on hormonal contraceptives.  She desires effective contraception.  The uterus was unable to be sounded in the office.  Care was also complicated by morbid obesity.  Hysteroscopy, D and C, and insertion of Mirena IUD were discussed with the patient preoperatively. Potential risks and complications were reviewed preoperatively including, but not limited to infection, uterine perforation, organ damage, bleeding requiring transfusion of blood products with HIV and hepatitis acquisition, DVT, PE, pneumonia, laparoscopy, laparotomy. Possible expulsion of the IUD, pain, bleeding with the IUD, failure of contraception, increased ectopic risk, and uterine perforation have been reviewed.  The patient states she understands and agrees and consent was signed on the chart.  FINDINGS:  Uterine cavity was normal.  Both fallopian tube and ostia are identified.  DESCRIPTION OF PROCEDURE:  The patient was taken to the operating room where she was identified, placed in  dorsal supine position and general anesthesia was induced via LMA.  She was placed in dorsal lithotomy position.  She was prepped vaginally with Betadine.  Bladder straight catheterized and draped in sterile fashion.  Time-out was undertaken. Bivalve speculum was placed.  Anterior cervical lip was injected with 1% lidocaine and grasped with a single-tooth tenaculum.  Paracervical block was placed at 2, 4, 5, 7, 8, and 10 o'clock positions with approximately 20 mL of the same solution.  Cervix was gently progressively dilated. Diagnostic hysteroscope was placed into cervical canal and advanced under direct visualization without difficulty.  The above findings were noted and the Hysteroscope was withdrawn.  Sharp curettage was carried out.  Inspection again revealed the cavity to be clean without abnormal structure.  Cavity was gently sounded to approximately 8 cm.  Mirena IUD was then placed gently without difficulty.  String was trimmed. Instruments are removed.  Counts were correct.  The patient was awakened, taken to the recovery room in stable condition.  I and Os distending media are 90 mL deficit.     Daleen Bo Gaetano Net, M.D.   ______________________________ Daleen Bo. Gaetano Net, M.D.    JET/MEDQ  D:  12/03/2016  T:  12/03/2016  Job:  [id number]

## 2016-12-03 NOTE — Progress Notes (Signed)
Patient now on [place] for [facility] Reviewed with patient procedure-H/S, D&C, IUD insertion All questions states she understands, all questions answered

## 2016-12-03 NOTE — Anesthesia Postprocedure Evaluation (Signed)
Anesthesia Post Note  Patient: [name] [name]  Procedure(s) Performed: Procedure(s) (LRB): DILATATION AND CURETTAGE /HYSTEROSCOPY (N/A) INTRAUTERINE DEVICE (IUD) INSERTION (N/A)     Patient location during evaluation: PACU Anesthesia Type: General Level of consciousness: awake and alert Pain management: pain level controlled Vital Signs Assessment: post-procedure vital signs reviewed and stable Respiratory status: spontaneous breathing, nonlabored ventilation, respiratory function stable and patient connected to nasal cannula oxygen Cardiovascular status: blood pressure returned to baseline and stable Postop Assessment: no signs of nausea or vomiting Anesthetic complications: no    Last Vitals:  Vitals:   12/03/16 0854 12/03/16 0927  BP:  (!) 145/89  Pulse: 92 85  Resp: 18 20  Temp: 36.7 C     Last Pain:  Vitals:   12/03/16 0854  TempSrc:   PainSc: 1    Pain Goal: Patients Stated Pain Goal: 4 (12/03/16 0608)               Thurmond Butts P Ellender

## 2016-12-03 NOTE — Anesthesia Procedure Notes (Signed)
Procedure Name: LMA Insertion Date/Time: 12/03/2016 7:38 AM Performed by: Flossie Dibble Pre-anesthesia Checklist: Patient identified, Emergency Drugs available, Suction available, Patient being monitored and Timeout performed Patient Re-evaluated:Patient Re-evaluated prior to induction Oxygen Delivery Method: Circle system utilized Preoxygenation: Pre-oxygenation with 100% oxygen Induction Type: IV induction Ventilation: Mask ventilation with difficulty LMA: LMA inserted LMA Size: 4.0 Number of attempts: 1 Airway Equipment and Method: Patient positioned with wedge pillow Placement Confirmation: positive ETCO2 and breath sounds checked- equal and bilateral Tube secured with: Tape Dental Injury: Teeth and Oropharynx as per pre-operative assessment

## 2016-12-03 NOTE — Transfer of Care (Signed)
Immediate Anesthesia Transfer of Care Note  Patient: [name] [name]  Procedure(s) Performed: Procedure(s) with comments: DILATATION AND CURETTAGE /HYSTEROSCOPY (N/A) INTRAUTERINE DEVICE (IUD) INSERTION (N/A) - MD BRINGING IUD FROM OFFICE Mirena   Lot # [id number]  Exp 05/2019  Patient Location: PACU  Anesthesia Type:General  Level of Consciousness: awake, alert  and oriented  Airway & Oxygen Therapy: Patient Spontanous Breathing and Patient connected to nasal cannula oxygen  Post-op Assessment: Report given to RN and Post -op Vital signs reviewed and stable  Post vital signs: Reviewed and stable  Last Vitals:  Vitals:   12/03/16 0608  BP: (!) 130/91  Pulse: 90  Resp: 16  Temp: 36.9 C    Last Pain:  Vitals:   12/03/16 0608  TempSrc: Oral  PainSc: 1       Patients Stated Pain Goal: 4 (74/14/23 9532)  Complications: No apparent anesthesia complications

## 2016-12-03 NOTE — Op Note (Deleted)
  The note originally documented on this encounter has been moved the the encounter in which it belongs.  

## 2016-12-03 NOTE — Brief Op Note (Signed)
12/03/2016  7:58 AM  PATIENT:  [name] [name]  31 y.o. female  PRE-OPERATIVE DIAGNOSIS:  CONTRACEPTION  POST-OPERATIVE DIAGNOSIS:  CONTRACEPTION  PROCEDURE:  Procedure(s) with comments: DILATATION AND CURETTAGE /HYSTEROSCOPY (N/A) INTRAUTERINE DEVICE (IUD) INSERTION (N/A) - MD BRINGING IUD FROM OFFICE  SURGEON:  Surgeon(s) and Role:    * Everlene Farrier, MD - Primary  PHYSICIAN ASSISTANT:   ASSISTANTS: none   ANESTHESIA:   general  EBL:  No intake/output data recorded.  BLOOD ADMINISTERED:none  DRAINS: none   LOCAL MEDICATIONS USED:  LIDOCAINE  and Amount: 20 ml  SPECIMEN:  Source of Specimen:  endometrial currettings  DISPOSITION OF SPECIMEN:  PATHOLOGY  COUNTS:  YES  TOURNIQUET:  * No tourniquets in log *  DICTATION: .Other Dictation: Dictation Number [id number]  PLAN OF CARE: Discharge to home after PACU  PATIENT DISPOSITION:  PACU - hemodynamically stable.   Delay start of Pharmacological VTE agent (>24hrs) due to surgical blood loss or risk of bleeding: not applicable

## 2016-12-03 NOTE — Discharge Instructions (Signed)
DISCHARGE INSTRUCTIONS: HYSTEROSCOPY  The following instructions have been prepared to help you care for yourself upon your return home.  May Remove Scop patch on or before 12/06/16    Personal hygiene:  Use sanitary pads for vaginal drainage, not tampons.  Shower the day after your procedure.  NO tub baths, pools or Jacuzzis for 2-3 weeks.  Wipe front to back after using the bathroom.  Activity and limitations:  Do NOT drive or operate any equipment for 24 hours. The effects of anesthesia are still present and drowsiness may result.  Do NOT rest in bed all day.  Walking is encouraged.  Walk up and down stairs slowly.  You may resume your normal activity in one to two days or as indicated by your physician. Sexual activity: NO intercourse for at least 2 weeks after the procedure, or as indicated by your Doctor.  Diet: Eat a light meal as desired this evening. You may resume your usual diet tomorrow.  Return to Work: You may resume your work activities in one to two days or as indicated by Marine scientist.  What to expect after your surgery: Expect to have vaginal bleeding/discharge for 2-3 days and spotting for up to 10 days. It is not unusual to have soreness for up to 1-2 weeks. You may have a slight burning sensation when you urinate for the first day. Mild cramps may continue for a couple of days. You may have a regular period in 2-6 weeks.  Call your doctor for any of the following:  Excessive vaginal bleeding or clotting, saturating and changing one pad every hour.  Inability to urinate 6 hours after discharge from hospital.  Pain not relieved by pain medication.  Fever of 100.4 F or greater.  Unusual vaginal discharge or odor.  Return to office _________________Call for an appointment ___________________ Patients signature: ______________________ Nurses signature ________________________  Post Anesthesia Care Unit [phone]    Post Anesthesia Home  Care Instructions  Activity: Get plenty of rest for the remainder of the day. A responsible individual must stay with you for 24 hours following the procedure.  For the next 24 hours, DO NOT: -Drive a car -Paediatric nurse -Drink alcoholic beverages -Take any medication unless instructed by your physician -Make any legal decisions or sign important papers.  Meals: Start with liquid foods such as gelatin or soup. Progress to regular foods as tolerated. Avoid greasy, spicy, heavy foods. If nausea and/or vomiting occur, drink only clear liquids until the nausea and/or vomiting subsides. Call your physician if vomiting continues.  Special Instructions/Symptoms: Your throat may feel dry or sore from the anesthesia or the breathing tube placed in your throat during surgery. If this causes discomfort, gargle with warm salt water. The discomfort should disappear within 24 hours.  If you had a scopolamine patch placed behind your ear for the management of post- operative nausea and/or vomiting:  1. The medication in the patch is effective for 72 hours, after which it should be removed.  Wrap patch in a tissue and discard in the trash. Wash hands thoroughly with soap and water. 2. You may remove the patch earlier than 72 hours if you experience unpleasant side effects which may include dry mouth, dizziness or visual disturbances. 3. Avoid touching the patch. Wash your hands with soap and water after contact with the patch.

## 2016-12-04 ENCOUNTER — Encounter (HOSPITAL_COMMUNITY): Payer: Self-pay | Admitting: Obstetrics and Gynecology

## 2017-03-31 ENCOUNTER — Inpatient Hospital Stay (HOSPITAL_COMMUNITY)
Admission: AD | Admit: 2017-03-31 | Discharge: 2017-04-01 | Disposition: A | Discharge status: Home / Self Care | Payer: BC Managed Care – PPO | Source: Ambulatory Visit | Attending: Obstetrics & Gynecology | Admitting: Obstetrics & Gynecology

## 2017-03-31 ENCOUNTER — Encounter (HOSPITAL_COMMUNITY): Payer: Self-pay

## 2017-03-31 DIAGNOSIS — Z79899 Other long term (current) drug therapy: Secondary | ICD-10-CM | POA: Insufficient documentation

## 2017-03-31 DIAGNOSIS — E039 Hypothyroidism, unspecified: Secondary | ICD-10-CM | POA: Diagnosis not present

## 2017-03-31 DIAGNOSIS — Z87891 Personal history of nicotine dependence: Secondary | ICD-10-CM | POA: Diagnosis not present

## 2017-03-31 DIAGNOSIS — N83202 Unspecified ovarian cyst, left side: Secondary | ICD-10-CM | POA: Diagnosis not present

## 2017-03-31 DIAGNOSIS — E669 Obesity, unspecified: Secondary | ICD-10-CM | POA: Insufficient documentation

## 2017-03-31 DIAGNOSIS — G5601 Carpal tunnel syndrome, right upper limb: Secondary | ICD-10-CM | POA: Diagnosis not present

## 2017-03-31 DIAGNOSIS — F419 Anxiety disorder, unspecified: Secondary | ICD-10-CM | POA: Insufficient documentation

## 2017-03-31 DIAGNOSIS — R1032 Left lower quadrant pain: Secondary | ICD-10-CM

## 2017-03-31 DIAGNOSIS — R109 Unspecified abdominal pain: Secondary | ICD-10-CM | POA: Diagnosis present

## 2017-03-31 LAB — CBC
HEMATOCRIT: 41.1 % (ref 36.0–46.0)
HEMOGLOBIN: 13.2 g/dL (ref 12.0–15.0)
MCH: 26.2 pg (ref 26.0–34.0)
MCHC: 32.1 g/dL (ref 30.0–36.0)
MCV: 81.7 fL (ref 78.0–100.0)
Platelets: 339 10*3/uL (ref 150–400)
RBC: 5.03 MIL/uL (ref 3.87–5.11)
RDW: 14.1 % (ref 11.5–15.5)
WBC: 13.9 10*3/uL — AB (ref 4.0–10.5)

## 2017-03-31 LAB — URINALYSIS, ROUTINE W REFLEX MICROSCOPIC
BILIRUBIN URINE: NEGATIVE
GLUCOSE, UA: NEGATIVE mg/dL
KETONES UR: NEGATIVE mg/dL
LEUKOCYTES UA: NEGATIVE
NITRITE: NEGATIVE
PH: 6 (ref 5.0–8.0)
Protein, ur: NEGATIVE mg/dL
Specific Gravity, Urine: 1.009 (ref 1.005–1.030)

## 2017-03-31 LAB — WET PREP, GENITAL
Clue Cells Wet Prep HPF POC: NONE SEEN
SPERM: NONE SEEN
Trich, Wet Prep: NONE SEEN
Yeast Wet Prep HPF POC: NONE SEEN

## 2017-03-31 LAB — POCT PREGNANCY, URINE: Preg Test, Ur: NEGATIVE

## 2017-03-31 MED ORDER — TRAMADOL HCL 50 MG PO TABS
50.0000 mg | ORAL_TABLET | Freq: Four times a day (QID) | ORAL | Status: DC
  Administered 2017-03-31: 50 mg via ORAL
  Filled 2017-03-31: qty 1

## 2017-03-31 NOTE — MAU Provider Note (Signed)
History     CSN: [id number]  Arrival date and time: 03/31/17 2005   First Provider Initiated Contact with Patient 03/31/17 2319      Chief Complaint  Patient presents with  . Abdominal Pain   31 y.o. Non-pregnant female here with LLQ pain. Pain started this evening. Describes as intermittent and sharp. She has not taken anything for the pain. Denies vaginal discharge. No urinary sx. She thinks the pain is from an ovarian cyst that was seen on [place] 2 weeks ago in the office. She reports the cyst was fluid filled and 4 cm. She is using Mirena for contraception.   Past Medical History:  Diagnosis Date  . Anxiety   . Carpal tunnel syndrome    right hand  . Dyspnea    uses albuterol inhaler with patient gets sick  . Environmental and seasonal allergies   . History of panic attacks   . Hypothyroid   . Obese   . Seasonal allergies     Past Surgical History:  Procedure Laterality Date  . CESAREAN SECTION N/A 06/23/2015   Procedure: CESAREAN SECTION;  Surgeon: Marylynn Pearson, MD;  Location: [facility];  Service: Obstetrics;  Laterality: N/A;  . CHOLECYSTECTOMY    . HYSTEROSCOPY W/D&C N/A 12/03/2016   Procedure: DILATATION AND CURETTAGE /HYSTEROSCOPY;  Surgeon: Everlene Farrier, MD;  Location: [facility];  Service: Gynecology;  Laterality: N/A;  . INTRAUTERINE DEVICE (IUD) INSERTION N/A 12/03/2016   Procedure: INTRAUTERINE DEVICE (IUD) INSERTION;  Surgeon: Everlene Farrier, MD;  Location: [facility];  Service: Gynecology;  Laterality: N/A;  MD BRINGING IUD FROM OFFICE Mirena   Lot # [id number]  Exp 05/2019  . MINOR CARPAL TUNNEL Left   . WISDOM TOOTH EXTRACTION      No family history on file.  Social History   Tobacco Use  . Smoking status: Former Smoker    Packs/day: 0.50    Years: 10.00    Pack years: 5.00    Types: Cigarettes    Last attempt to quit: 06/08/2011    Years since quitting: 5.8  . Smokeless tobacco: Never Used  Substance Use Topics  . Alcohol use: No  . Drug use: No     Allergies:  Allergies  Allergen Reactions  . Ibuprofen Hives  . Sulfa Antibiotics Hives    Medications Prior to Admission  Medication Sig Dispense Refill Last Dose  . Ketotifen Fumarate (ALAWAY OP) Place 1 drop into both eyes daily as needed (ALLERGIES).   Past Week at Unknown time  . levothyroxine (SYNTHROID, LEVOTHROID) 25 MCG tablet Take 25 mcg by mouth daily before breakfast.   03/31/2017 at Unknown time  . loratadine (CLARITIN) 10 MG tablet Take 10 mg by mouth daily.   03/31/2017 at Unknown time  . losartan (COZAAR) 25 MG tablet Take 25 mg by mouth daily.   03/31/2017 at Unknown time  . montelukast (SINGULAIR) 10 MG tablet Take 10 mg by mouth at bedtime.   03/30/2017 at Unknown time  . Olopatadine HCl (PATANASE) 0.6 % SOLN Place 1 drop into both nostrils 2 (two) times daily.    Past Month at Unknown time  . venlafaxine XR (EFFEXOR-XR) 150 MG 24 hr capsule Take 150 mg by mouth daily with breakfast.   03/31/2017 at Unknown time  . albuterol (PROVENTIL HFA;VENTOLIN HFA) 108 (90 BASE) MCG/ACT inhaler Inhale 2 puffs into the lungs every 6 (six) hours as needed for wheezing or shortness of breath.    Unknown at Unknown time  . EPINEPHrine (  EPIPEN 2-PAK) 0.3 mg/0.3 mL IJ SOAJ injection Inject 0.3 mg into the muscle once.   rescue  . fluticasone (FLONASE) 50 MCG/ACT nasal spray Place 1 spray into both nostrils daily as needed for allergies or rhinitis.   More than a month at Unknown time    Review of Systems  Constitutional: Negative.   Gastrointestinal: Positive for abdominal pain.  Genitourinary: Negative for vaginal discharge.   Physical Exam   Blood pressure 128/66, pulse (!) 103, temperature 98.1 F (36.7 C), temperature source Oral, resp. rate 20, height 5' 5.5" (1.664 m), weight (!) 321 lb (145.6 kg), last menstrual period 03/18/2017, SpO2 99 %, unknown if currently breastfeeding.  Physical Exam  Constitutional: She is oriented to person, place, and time. She appears  well-developed and well-nourished. No distress.  HENT:  Head: Normocephalic and atraumatic.  Neck: Normal range of motion.  Respiratory: Effort normal. No respiratory distress.  GI: Soft. She exhibits no distension and no mass. There is no tenderness. There is no rebound and no guarding.  Genitourinary:  Genitourinary Comments: External: no lesions or erythema Vagina: rugated, pink, moist, scant brown discharge, IUS strings seen Uterus/Adnexae: exam inconclusive d/t body habitus  Musculoskeletal: Normal range of motion.  Neurological: She is alert and oriented to person, place, and time.  Skin: Skin is warm and dry.  Psychiatric: She has a normal mood and affect.   Results for orders placed or performed during the hospital encounter of 03/31/17 (from the past 24 hour(s))  Urinalysis, Routine w reflex microscopic     Status: Abnormal   Collection Time: 03/31/17  8:09 PM  Result Value Ref Range   Color, Urine STRAW (A) YELLOW   APPearance CLEAR CLEAR   Specific Gravity, Urine 1.009 1.005 - 1.030   pH 6.0 5.0 - 8.0   Glucose, UA NEGATIVE NEGATIVE mg/dL   Hgb urine dipstick SMALL (A) NEGATIVE   Bilirubin Urine NEGATIVE NEGATIVE   Ketones, ur NEGATIVE NEGATIVE mg/dL   Protein, ur NEGATIVE NEGATIVE mg/dL   Nitrite NEGATIVE NEGATIVE   Leukocytes, UA NEGATIVE NEGATIVE   RBC / HPF 0-5 0 - 5 RBC/hpf   WBC, UA 0-5 0 - 5 WBC/hpf   Bacteria, UA RARE (A) NONE SEEN   Squamous Epithelial / LPF 0-5 (A) NONE SEEN   Mucus PRESENT   Pregnancy, urine POC     Status: None   Collection Time: 03/31/17  8:59 PM  Result Value Ref Range   Preg Test, Ur NEGATIVE NEGATIVE  Wet prep, genital     Status: Abnormal   Collection Time: 03/31/17 11:20 PM  Result Value Ref Range   Yeast Wet Prep HPF POC NONE SEEN NONE SEEN   Trich, Wet Prep NONE SEEN NONE SEEN   Clue Cells Wet Prep HPF POC NONE SEEN NONE SEEN   WBC, Wet Prep HPF POC FEW (A) NONE SEEN   Sperm NONE SEEN   CBC     Status: Abnormal    Collection Time: 03/31/17 11:49 PM  Result Value Ref Range   WBC 13.9 (H) 4.0 - 10.5 K/uL   RBC 5.03 3.87 - 5.11 MIL/uL   Hemoglobin 13.2 12.0 - 15.0 g/dL   HCT 41.1 36.0 - 46.0 %   MCV 81.7 78.0 - 100.0 fL   MCH 26.2 26.0 - 34.0 pg   MCHC 32.1 30.0 - 36.0 g/dL   RDW 14.1 11.5 - 15.5 %   Platelets 339 150 - 400 K/uL   US Pelvis Transvanginal Non-ob (tv Only)  Result Date: 04/01/2017 CLINICAL DATA:  Left lower quadrant pain EXAM: TRANSABDOMINAL AND TRANSVAGINAL ULTRASOUND OF PELVIS DOPPLER ULTRASOUND OF OVARIES TECHNIQUE: Both transabdominal and transvaginal ultrasound examinations of the pelvis were performed. Transabdominal technique was performed for global imaging of the pelvis including uterus, ovaries, adnexal regions, and pelvic cul-de-sac. It was necessary to proceed with endovaginal exam following the transabdominal exam to visualize the ovaries. Color and duplex Doppler ultrasound was utilized to evaluate blood flow to the ovaries. COMPARISON:  None FINDINGS: Uterus Measurements: 8.3 x 4.3 x 5.7 cm. No fibroids or other mass visualized. Endometrium Difficult to measure due to intrauterine device No focal abnormality visualized. Right ovary Measurements: 6.5 x 2.2 x 2.6 cm. Normal appearance/no adnexal mass. Left ovary Measurements: 5 x 3.1 x 3.8 cm. 3.5 cm cyst in the left ovary. Pulsed Doppler evaluation of both ovaries demonstrates normal low-resistance arterial and venous waveforms. Other findings No abnormal free fluid. IMPRESSION: 1. Negative for ovarian torsion 2. 3.5 cm cyst in the left ovary. This is almost certainly benign, and no specific imaging follow up is recommended according to the Society of Radiologists in [place] Statement (D Clovis Riley et al. Management of Asymptomatic Ovarian and Other Adnexal Cysts Imaged at [place]: Society of Radiologists in [place] Statement 2010. Radiology 256 (Sept 2010): 585-277.). Electronically Signed    By: Donavan Foil M.D.   On: 04/01/2017 01:09   US Pelvis (transabdominal Only)  Result Date: 04/01/2017 CLINICAL DATA:  Left lower quadrant pain EXAM: TRANSABDOMINAL AND TRANSVAGINAL ULTRASOUND OF PELVIS DOPPLER ULTRASOUND OF OVARIES TECHNIQUE: Both transabdominal and transvaginal ultrasound examinations of the pelvis were performed. Transabdominal technique was performed for global imaging of the pelvis including uterus, ovaries, adnexal regions, and pelvic cul-de-sac. It was necessary to proceed with endovaginal exam following the transabdominal exam to visualize the ovaries. Color and duplex Doppler ultrasound was utilized to evaluate blood flow to the ovaries. COMPARISON:  None FINDINGS: Uterus Measurements: 8.3 x 4.3 x 5.7 cm. No fibroids or other mass visualized. Endometrium Difficult to measure due to intrauterine device No focal abnormality visualized. Right ovary Measurements: 6.5 x 2.2 x 2.6 cm. Normal appearance/no adnexal mass. Left ovary Measurements: 5 x 3.1 x 3.8 cm. 3.5 cm cyst in the left ovary. Pulsed Doppler evaluation of both ovaries demonstrates normal low-resistance arterial and venous waveforms. Other findings No abnormal free fluid. IMPRESSION: 1. Negative for ovarian torsion 2. 3.5 cm cyst in the left ovary. This is almost certainly benign, and no specific imaging follow up is recommended according to the Society of Radiologists in [place] Statement (D Clovis Riley et al. Management of Asymptomatic Ovarian and Other Adnexal Cysts Imaged at [place]: Society of Radiologists in [place] Statement 2010. Radiology 256 (Sept 2010): 824-235.). Electronically Signed   By: Donavan Foil M.D.   On: 04/01/2017 01:09   US Pelvic Doppler (torsion R/o Or Mass Arterial Flow)  Result Date: 04/01/2017 CLINICAL DATA:  Left lower quadrant pain EXAM: TRANSABDOMINAL AND TRANSVAGINAL ULTRASOUND OF PELVIS DOPPLER ULTRASOUND OF OVARIES TECHNIQUE: Both  transabdominal and transvaginal ultrasound examinations of the pelvis were performed. Transabdominal technique was performed for global imaging of the pelvis including uterus, ovaries, adnexal regions, and pelvic cul-de-sac. It was necessary to proceed with endovaginal exam following the transabdominal exam to visualize the ovaries. Color and duplex Doppler ultrasound was utilized to evaluate blood flow to the ovaries. COMPARISON:  None FINDINGS: Uterus Measurements: 8.3 x 4.3 x 5.7 cm. No fibroids or other mass visualized.  Endometrium Difficult to measure due to intrauterine device No focal abnormality visualized. Right ovary Measurements: 6.5 x 2.2 x 2.6 cm. Normal appearance/no adnexal mass. Left ovary Measurements: 5 x 3.1 x 3.8 cm. 3.5 cm cyst in the left ovary. Pulsed Doppler evaluation of both ovaries demonstrates normal low-resistance arterial and venous waveforms. Other findings No abnormal free fluid. IMPRESSION: 1. Negative for ovarian torsion 2. 3.5 cm cyst in the left ovary. This is almost certainly benign, and no specific imaging follow up is recommended according to the Society of Radiologists in [place] Statement (D Clovis Riley et al. Management of Asymptomatic Ovarian and Other Adnexal Cysts Imaged at [place]: Society of Radiologists in [place] Statement 2010. Radiology 256 (Sept 2010): 220-254.). Electronically Signed   By: Donavan Foil M.D.   On: 04/01/2017 01:09   MAU Course  Procedures Ultram  MDM Labs and [place] ordered and reviewed. Presentation, clinical findings, and plan discussed with Dr. Lynnette Caffey. No evidence of torsion. Pain improved after Ultram. Stable for discharge home.   Assessment and Plan   1. Left ovarian cyst   2. LLQ pain    Discharge home Follow up with Dr. Gaetano Net as needed Rx Ultram  Allergies as of 04/01/2017      Reactions   Ibuprofen Hives   Sulfa Antibiotics Hives      Medication List    TAKE these  medications   ALAWAY OP Place 1 drop into both eyes daily as needed (ALLERGIES).   albuterol 108 (90 Base) MCG/ACT inhaler Commonly known as:  PROVENTIL HFA;VENTOLIN HFA Inhale 2 puffs into the lungs every 6 (six) hours as needed for wheezing or shortness of breath.   EPIPEN 2-PAK 0.3 mg/0.3 mL Soaj injection Generic drug:  EPINEPHrine Inject 0.3 mg into the muscle once.   fluticasone 50 MCG/ACT nasal spray Commonly known as:  FLONASE Place 1 spray into both nostrils daily as needed for allergies or rhinitis.   levothyroxine 25 MCG tablet Commonly known as:  SYNTHROID, LEVOTHROID Take 25 mcg by mouth daily before breakfast.   loratadine 10 MG tablet Commonly known as:  CLARITIN Take 10 mg by mouth daily.   losartan 25 MG tablet Commonly known as:  COZAAR Take 25 mg by mouth daily.   montelukast 10 MG tablet Commonly known as:  SINGULAIR Take 10 mg by mouth at bedtime.   PATANASE 0.6 % Soln Generic drug:  Olopatadine HCl Place 1 drop into both nostrils 2 (two) times daily.   traMADol 50 MG tablet Commonly known as:  ULTRAM Take 1 tablet (50 mg total) by mouth every 6 (six) hours as needed for moderate pain or severe pain.   venlafaxine XR 150 MG 24 hr capsule Commonly known as:  EFFEXOR-XR Take 150 mg by mouth daily with breakfast.      Julianne Handler, CNM 04/01/2017, 1:31 AM

## 2017-03-31 NOTE — MAU Note (Signed)
Pt here with reports of abdominal pain that started today while driving home. States Dr. Gaetano Net told her on 03/18/2017 that she had a 4cm ovarian cyst on left ovary. States the pain is dull and achy. Has some pressure in vagina. States pain is 3/10-has not taken anything. Pt reports occasional vaginal spotting. LMP: 03/18/2017.

## 2017-04-01 ENCOUNTER — Inpatient Hospital Stay (HOSPITAL_COMMUNITY): Payer: BC Managed Care – PPO

## 2017-04-01 LAB — CERVICOVAGINAL ANCILLARY ONLY
Chlamydia: NEGATIVE
NEISSERIA GONORRHEA: NEGATIVE

## 2017-04-01 MED ORDER — TRAMADOL HCL 50 MG PO TABS: 50.0000 mg | ORAL_TABLET | Freq: Four times a day (QID) | ORAL | 0 refills | Status: DC | PRN

## 2017-04-01 NOTE — Discharge Instructions (Signed)
Ovarian Cyst °An ovarian cyst is a fluid-filled sac on an ovary. The ovaries are organs that make eggs in women. Most ovarian cysts go away on their own and are not cancerous (are benign). Some cysts need treatment. °Follow these instructions at home: °· Take over-the-counter and prescription medicines only as told by your doctor. °· Do not drive or use heavy machinery while taking prescription pain medicine. °· Get pelvic exams and Pap tests as often as told by your doctor. °· Return to your normal activities as told by your doctor. Ask your doctor what activities are safe for you. °· Do not use any products that contain nicotine or tobacco, such as cigarettes and e-cigarettes. If you need help quitting, ask your doctor. °· Keep all follow-up visits as told by your doctor. This is important. °Contact a doctor if: °· Your periods are: °¨ Late. °¨ Irregular. °¨ Painful. °· Your periods stop. °· You have pelvic pain that does not go away. °· You have pressure on your bladder. °· You have trouble making your bladder empty when you pee (urinate). °· You have pain during sex. °· You have any of the following in your belly (abdomen): °¨ A feeling of fullness. °¨ Pressure. °¨ Discomfort. °¨ Pain that does not go away. °¨ Swelling. °· You feel sick most of the time. °· You have trouble pooping (have constipation). °· You are not as hungry as usual (you lose your appetite). °· You get very bad acne. °· You start to have more hair on your body and face. °· You are gaining weight or losing weight without changing your exercise and eating habits. °· You think you may be pregnant. °Get help right away if: °· You have belly pain that is very bad or gets worse. °· You cannot eat or drink without throwing up (vomiting). °· You suddenly get a fever. °· Your period is a lot heavier than usual. °This information is not intended to replace advice given to you by your health care provider. Make sure you discuss any questions you have  with your health care provider. °Document Released: 10/10/2007 Document Revised: 11/11/2015 Document Reviewed: 09/25/2015 °Elsevier Interactive Patient Education © 2017 Elsevier Inc. ° °

## 2017-05-07 DIAGNOSIS — I10 Essential (primary) hypertension: Secondary | ICD-10-CM

## 2017-05-07 HISTORY — DX: Essential (primary) hypertension: I10

## 2017-06-17 IMAGING — US US OB TRANSVAGINAL
1 series · 15 of 28 positions shown · non-contrast
Comparison: No priors.

CLINICAL DATA: 28-year-old female pregnant patient with pelvic
cramping and vaginal spotting.

EXAM:
OBSTETRIC <14 WK US AND TRANSVAGINAL OB US
TECHNIQUE: Both transabdominal and transvaginal ultrasound examinations were
performed for complete evaluation of the gestation as well as the
maternal uterus, adnexal regions, and pelvic cul-de-sac.
Transvaginal technique was performed to assess early pregnancy.

[Series 1: us ob transvaginal · 15 of 30 slices shown]
[im 1/30]
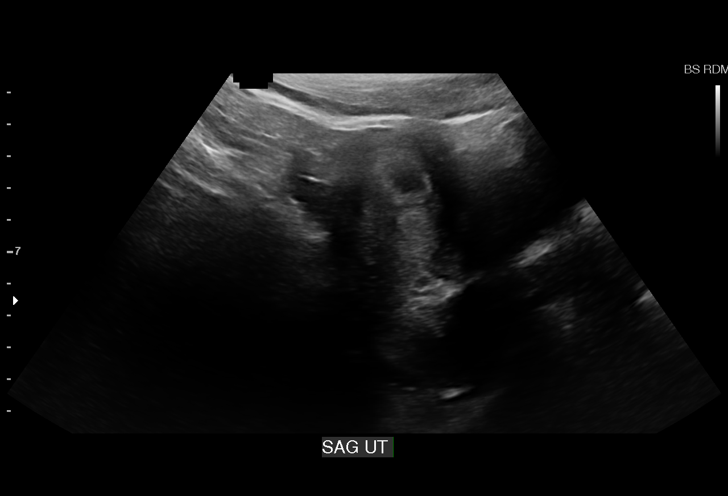
[im 3/30]
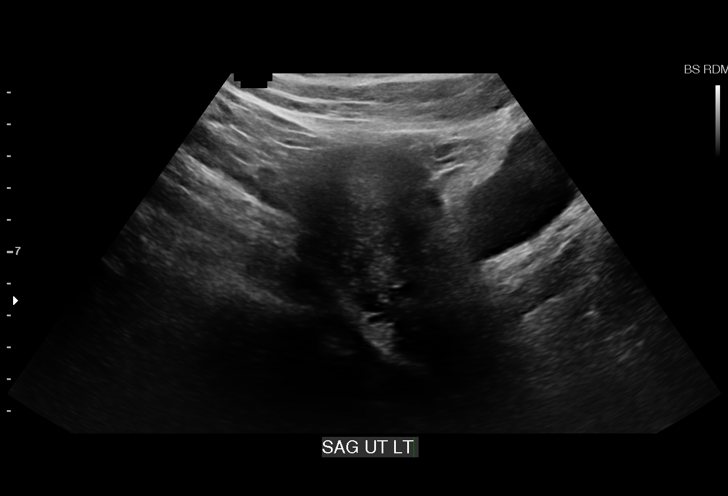
[im 5/30]
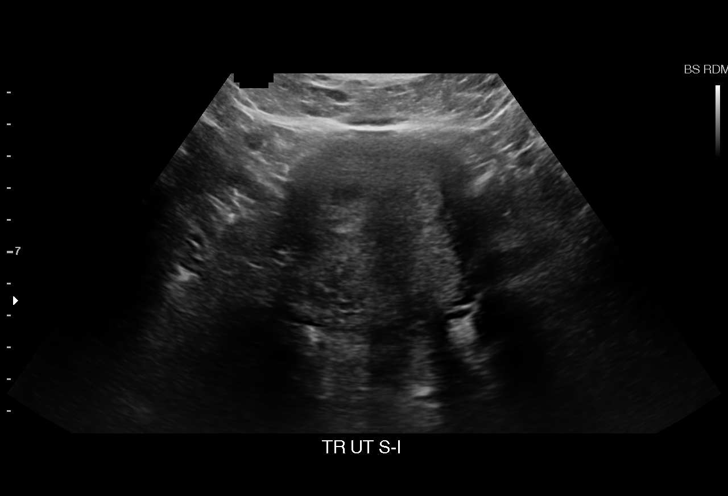
[im 7/30]
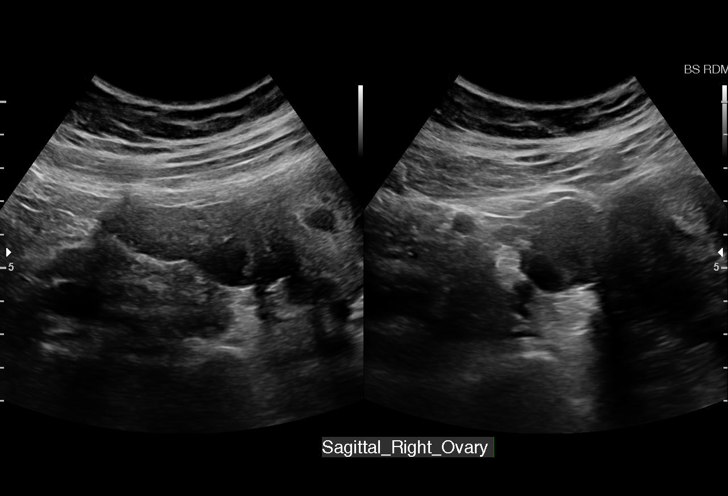
[im 9/30]
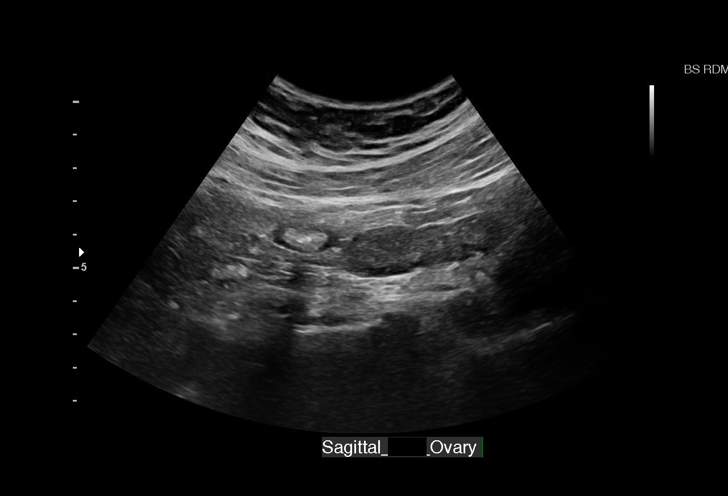
[im 11/30]
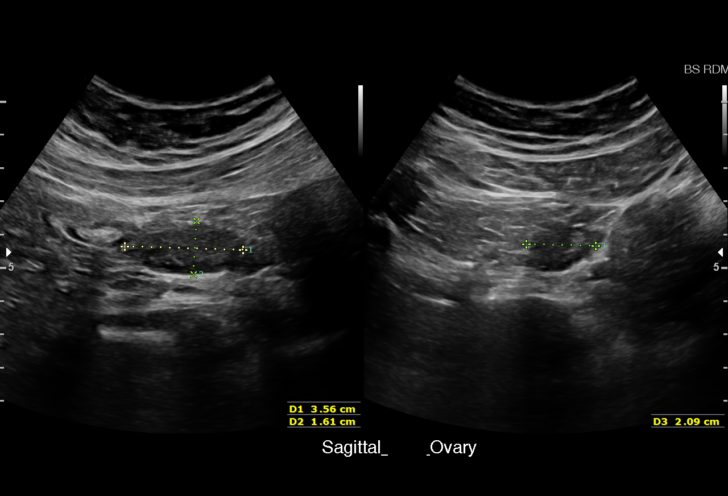
[im 13/30]
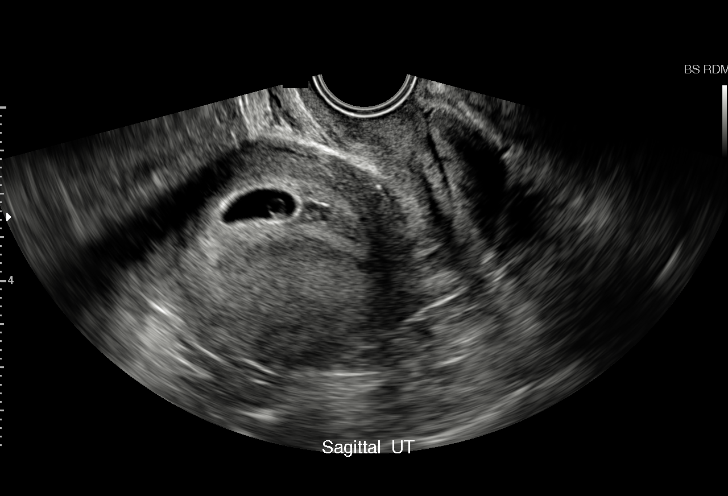
[im 16/30]
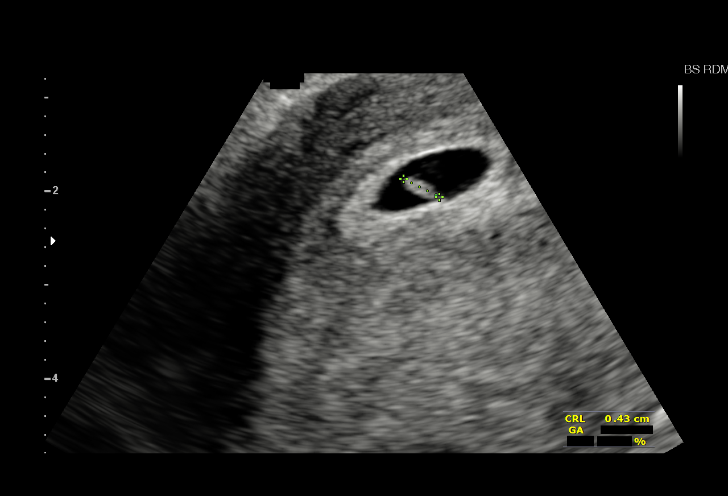
[im 17/30]
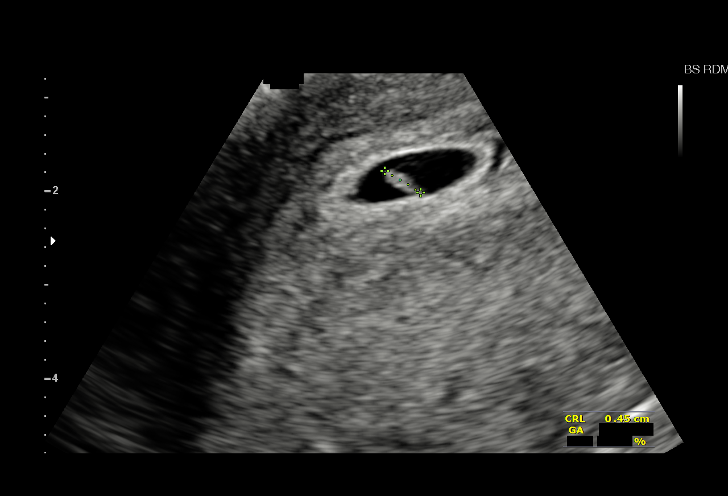
[im 19/30]
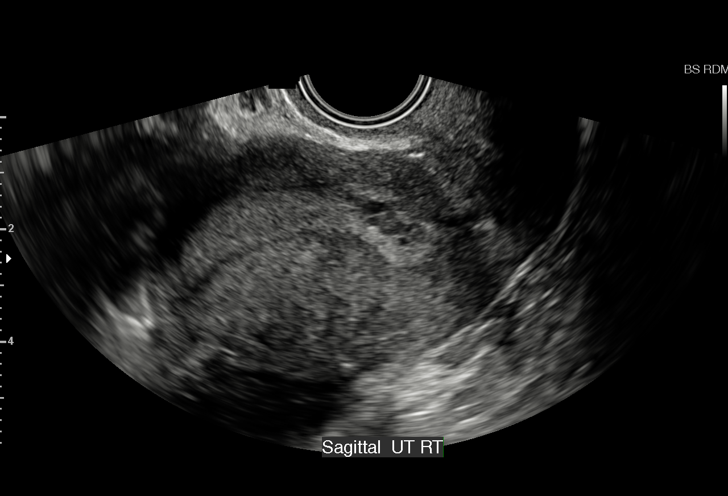
[im 21/30]
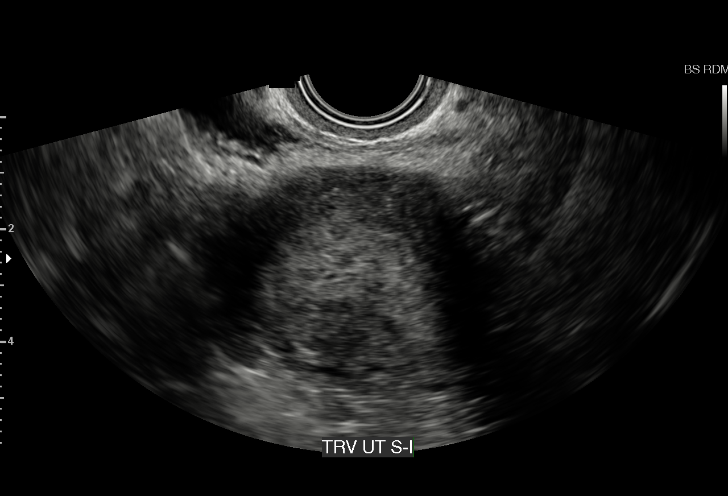
[im 23/30]
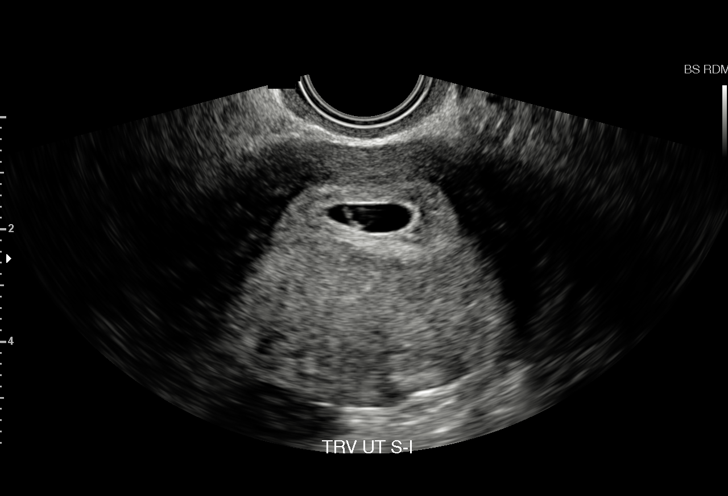
[im 25/30]
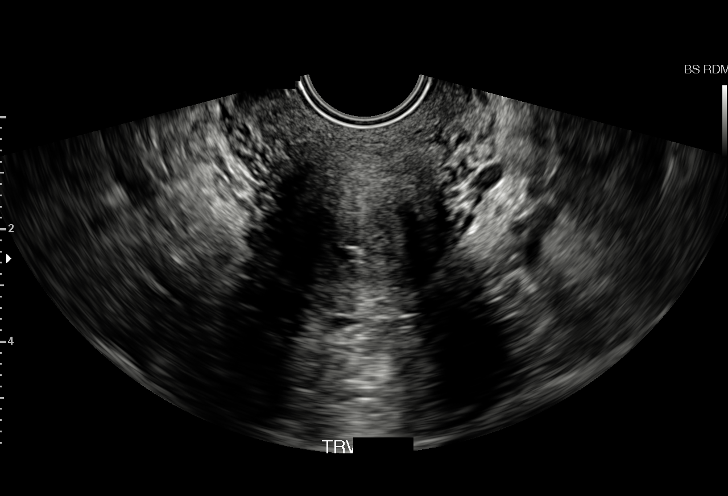
[im 27/30]
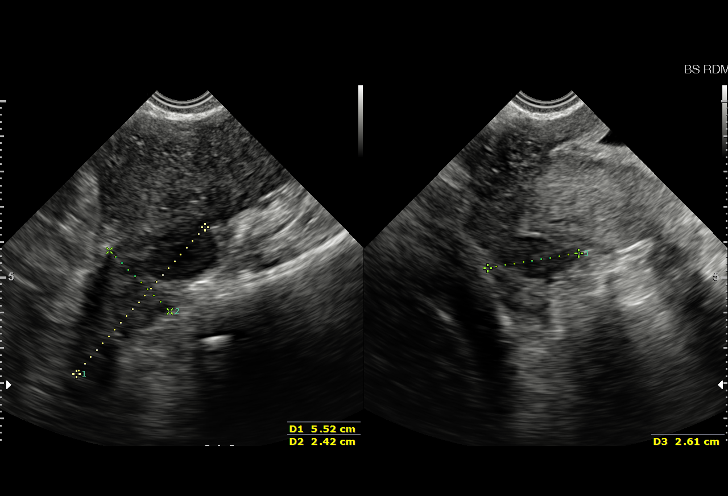
[im 30/30]
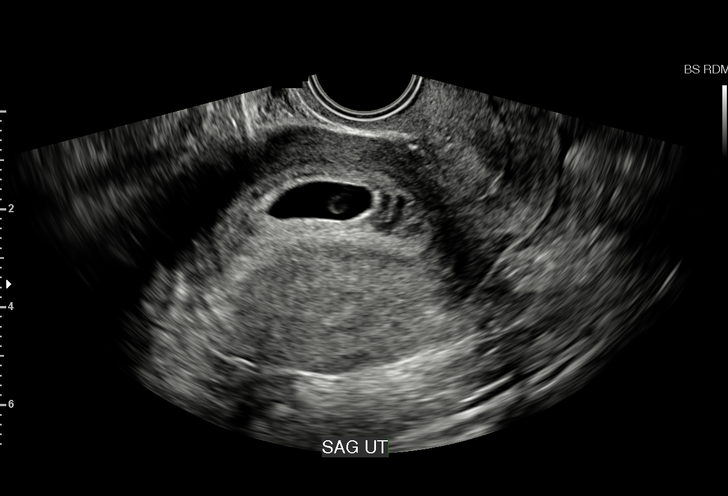

[15 of 28 positions shown; findings below may reference images not displayed]

FINDINGS: Intrauterine gestational sac: Single gestational sac noted in the
fundal portion of the endometrial canal.

Yolk sac:  Present.

Embryo:  Present.

Cardiac Activity: Present.

Heart Rate: 110  bpm

CRL:  4.5  mm   6 w   1 d                  US EDC: 09/03/2015

Maternal uterus/adnexae: Small area adjacent to the gestational sac
in the endometrial canal demonstrating heterogeneous hypoechoic
echotexture reportedly corresponded to some prominent vessels per
discussion with sonographer. No definite subchorionic hemorrhage.
Bilateral ovaries are normal in appearance. No significant free
fluid within the cul-de-sac.
IMPRESSION: 1. Single viable IUP with estimated gestational age of 6 weeks and 1
day and normal fetal heart rate of 110 beats per minute.
2. No acute findings.

## 2017-10-11 DIAGNOSIS — J302 Other seasonal allergic rhinitis: Secondary | ICD-10-CM | POA: Insufficient documentation

## 2017-10-11 DIAGNOSIS — E039 Hypothyroidism, unspecified: Secondary | ICD-10-CM | POA: Insufficient documentation

## 2018-06-03 DIAGNOSIS — I10 Essential (primary) hypertension: Secondary | ICD-10-CM | POA: Insufficient documentation

## 2019-01-16 DIAGNOSIS — F411 Generalized anxiety disorder: Secondary | ICD-10-CM | POA: Insufficient documentation

## 2019-09-08 IMAGING — US US TRANSVAGINAL NON-OB
1 series · 15 of 25 positions shown · non-contrast
Comparison: None

CLINICAL DATA: Left lower quadrant pain

EXAM:
TRANSABDOMINAL AND TRANSVAGINAL ULTRASOUND OF PELVIS
DOPPLER ULTRASOUND OF OVARIES
TECHNIQUE: Both transabdominal and transvaginal ultrasound examinations of the
pelvis were performed. Transabdominal technique was performed for
global imaging of the pelvis including uterus, ovaries, adnexal
regions, and pelvic cul-de-sac.
It was necessary to proceed with endovaginal exam following the
transabdominal exam to visualize the ovaries. Color and duplex
Doppler ultrasound was utilized to evaluate blood flow to the
ovaries.

[Series 1: us transvaginal non-ob · 15 of 82 slices shown]
[im 1/82]
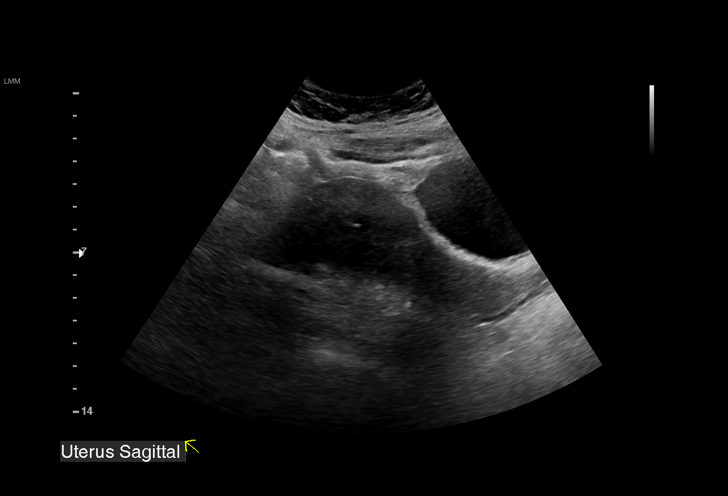
[im 7/82]
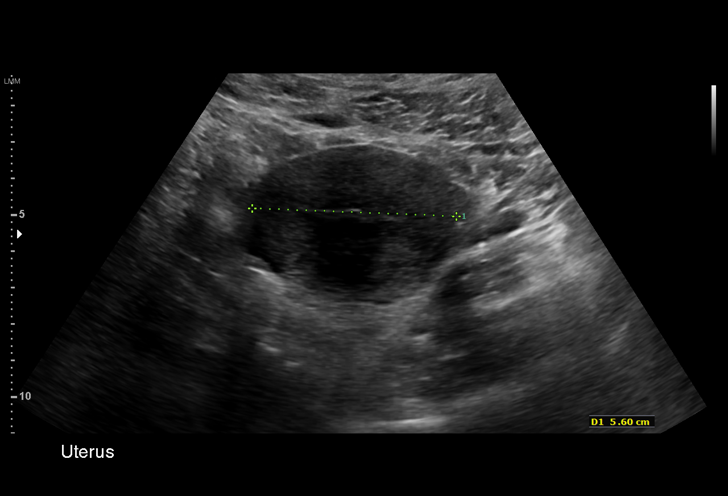
[im 14/82]
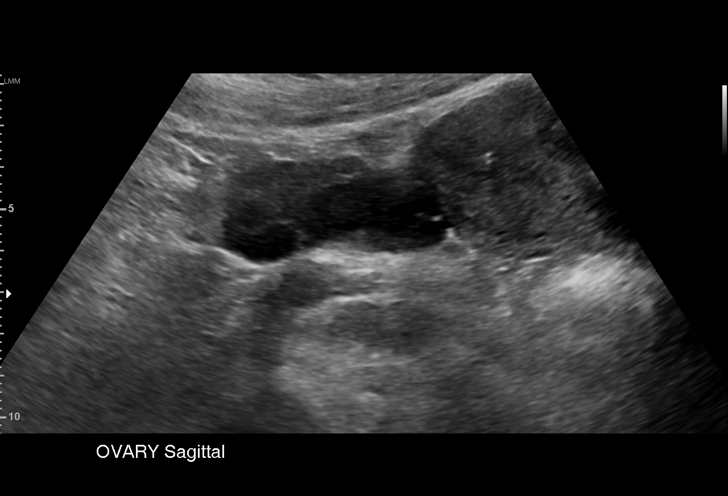
[im 17/82]
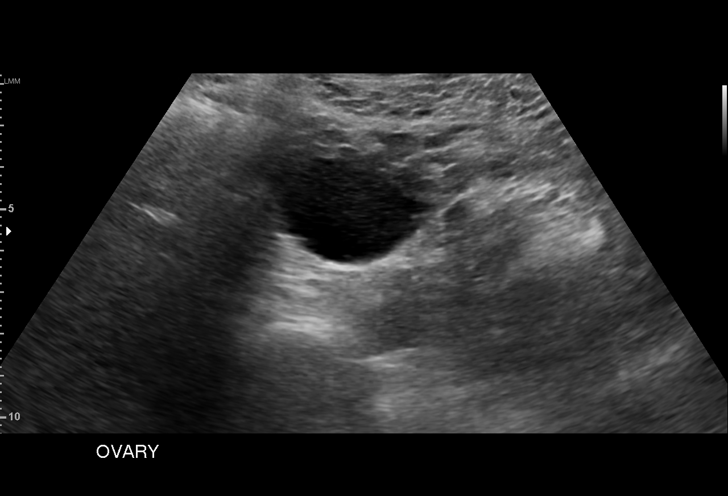
[im 24/82]
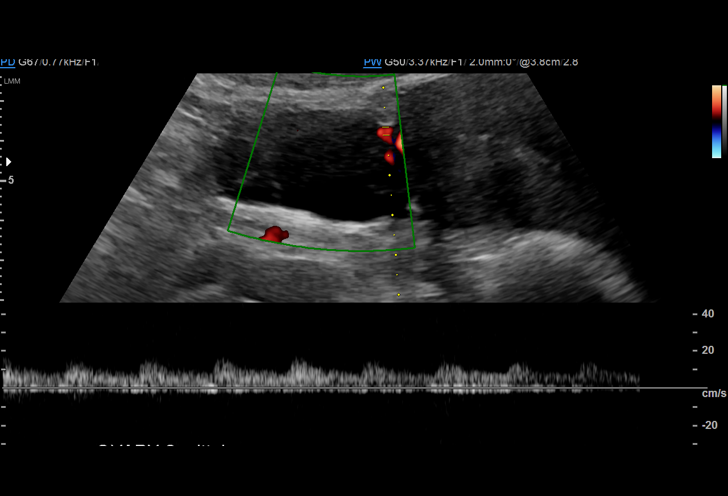
[im 31/82]
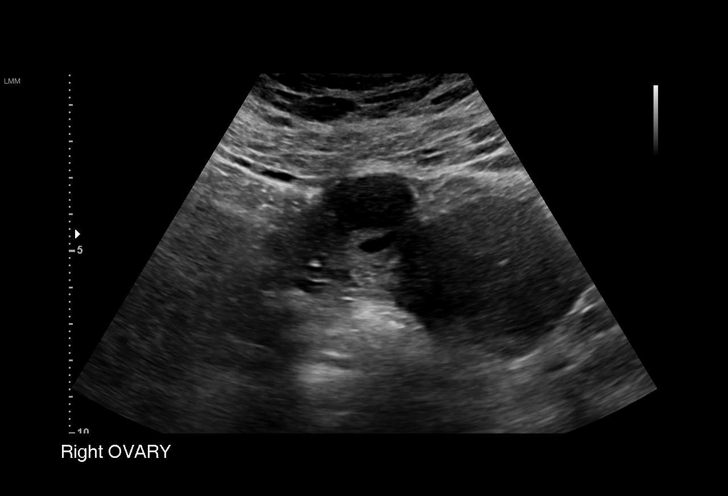
[im 34/82]
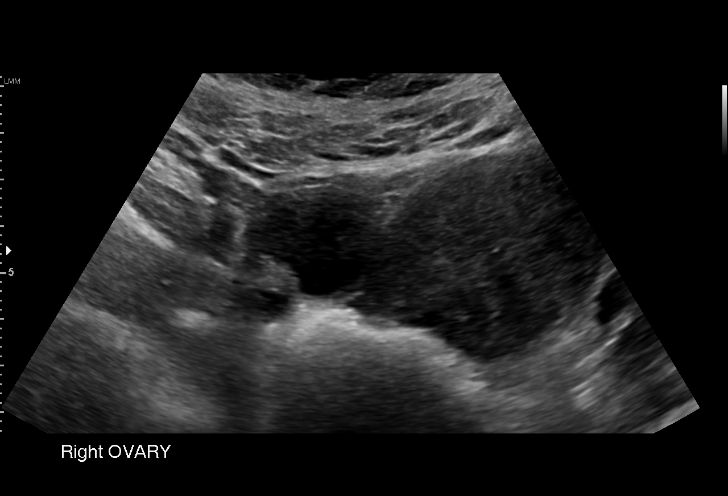
[im 41/82]
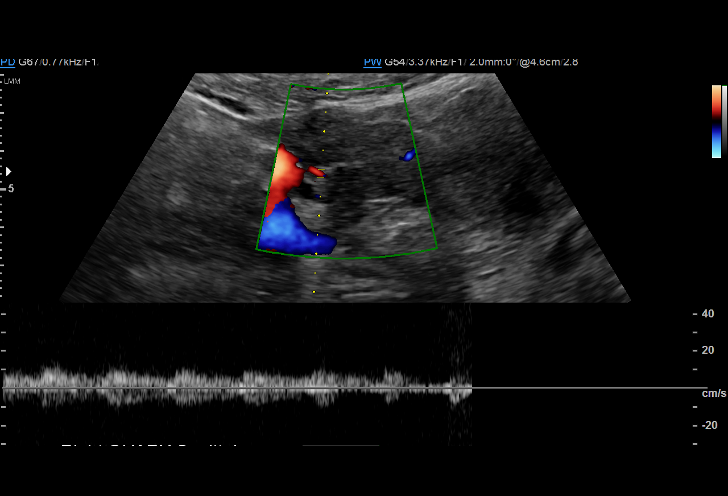
[im 48/82]
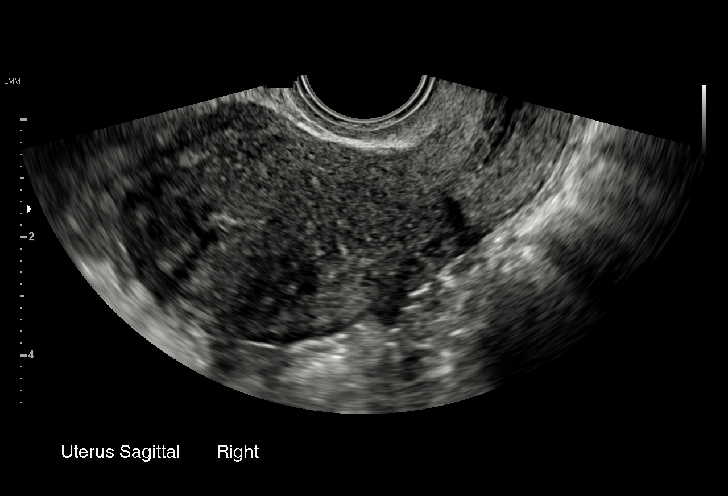
[im 51/82]
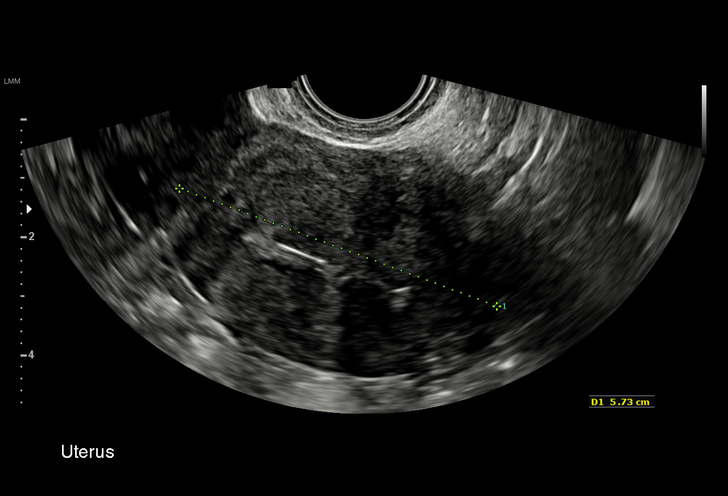
[im 58/82]
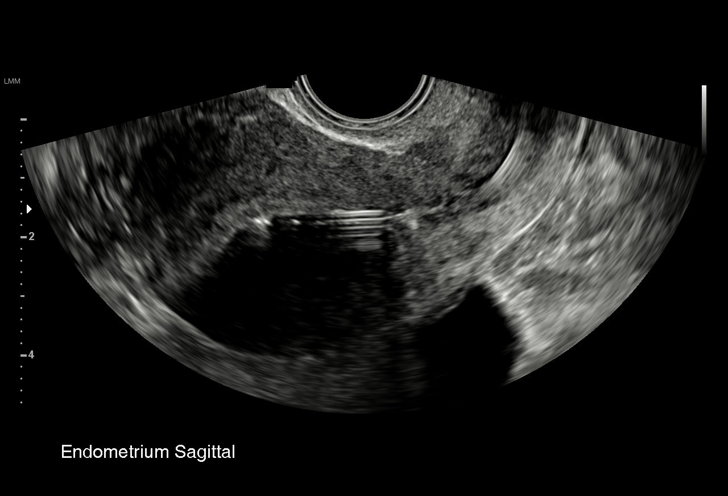
[im 65/82]
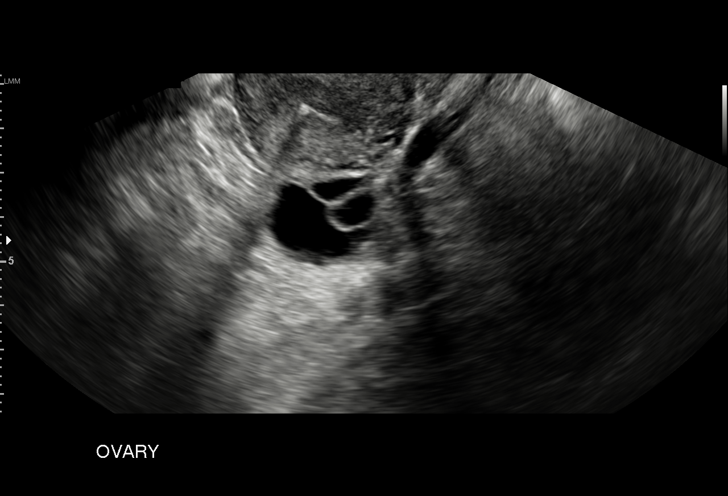
[im 68/82]
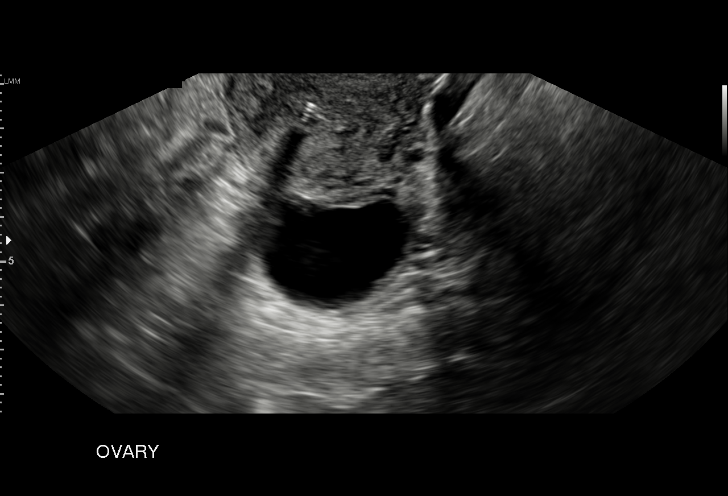
[im 75/82]
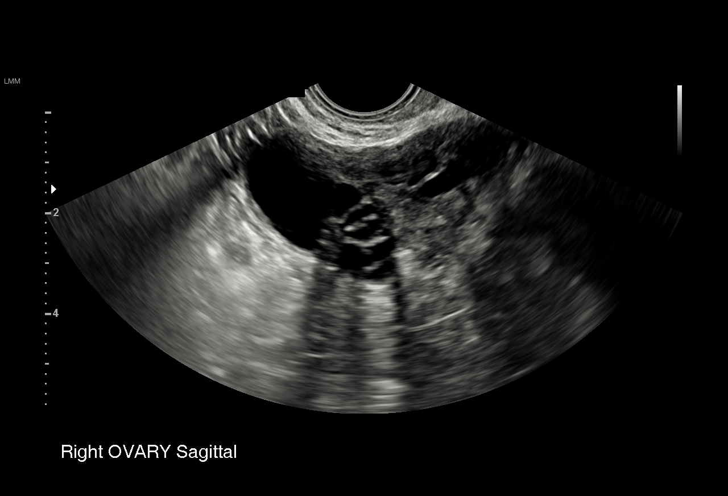
[im 82/82]
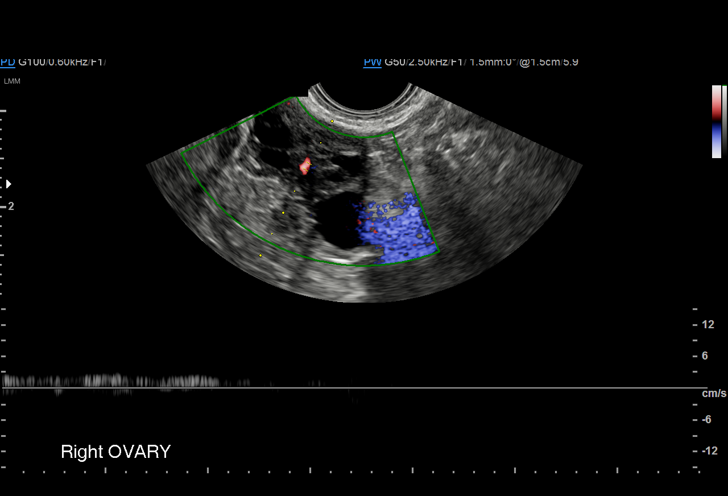

[15 of 25 positions shown; findings below may reference images not displayed]

FINDINGS: Uterus

Measurements: 8.3 x 4.3 x 5.7 cm. No fibroids or other mass
visualized.

Endometrium

Difficult to measure due to intrauterine device No focal abnormality
visualized.

Right ovary

Measurements: 6.5 x 2.2 x 2.6 cm. Normal appearance/no adnexal mass.

Left ovary

Measurements: 5 x 3.1 x 3.8 cm. 3.5 cm cyst in the left ovary.

Pulsed Doppler evaluation of both ovaries demonstrates normal
low-resistance arterial and venous waveforms.

Other findings

No abnormal free fluid.
IMPRESSION: 1. Negative for ovarian torsion
2. 3.5 cm cyst in the left ovary. This is almost certainly benign,
and no specific imaging follow up is recommended according to the
Society of Radiologists in [place] Consensus Conference
Statement (Remedios Eriksen et al. Management of Asymptomatic Ovarian and
Other Adnexal Cysts Imaged at [place]: Society of Radiologists in

## 2019-12-10 DIAGNOSIS — F419 Anxiety disorder, unspecified: Secondary | ICD-10-CM | POA: Insufficient documentation

## 2019-12-18 NOTE — Pre-Procedure Instructions (Signed)
[id number] - [place], [place] [address] Phone: [phone] Fax: [phone]      Your procedure is scheduled on Thursday August 19th.  Report to [facility] Main Entrance "A" at 5:30 A.M., and check in at the Admitting office.  Call this number if you have problems the morning of surgery:  [phone]  Call [phone] if you have any questions prior to your surgery date Monday-Friday 8am-4pm    Remember:  Do not eat after midnight the night before your surgery  You may drink clear liquids until 4:30am the morning of your surgery.   Clear liquids allowed are: Water, Non-Citrus Juices (without pulp), Carbonated Beverages, Clear Tea, Black Coffee Only, and Gatorade    Take these medicines the morning of surgery with A SIP OF WATER   labetalol (NORMODYNE) 100 MG tablet  levothyroxine (SYNTHROID, LEVOTHROID) 25 MCG tablet  Olopatadine HCl (PATANASE) 0.6 % SOLN    EPINEPHrine (EPIPEN 2-PAK) 0.3 mg/0.3 mL IJ SOAJ injection if needed  fluticasone (FLONASE) 50 MCG/ACT nasal spray   As of today, STOP taking any Aspirin (unless otherwise instructed by your surgeon) Aleve, Naproxen, Ibuprofen, Motrin, Advil, Goody's, BC's, all herbal medications, fish oil, and all vitamins.                      Do not wear jewelry, make up, or nail polish            Do not wear lotions, powders, perfumes/colognes, or deodorant.            Do not shave 48 hours prior to surgery.  Men may shave face and neck.            Do not bring valuables to the hospital.            [facility] is not responsible for any belongings or valuables.  Do NOT Smoke (Tobacco/Vaping) or drink Alcohol 24 hours prior to your procedure If you use a CPAP at night, you may bring all equipment for your overnight stay.   Contacts, glasses, dentures or bridgework may not be worn into surgery.      For patients admitted to the hospital, discharge time will be determined by your  treatment team.   Patients discharged the day of surgery will not be allowed to drive home, and someone needs to stay with them for 24 hours.    Special instructions:   [facility]- Preparing For Surgery  Before surgery, you can play an important role. Because skin is not sterile, your skin needs to be as free of germs as possible. You can reduce the number of germs on your skin by washing with CHG (chlorahexidine gluconate) Soap before surgery.  CHG is an antiseptic cleaner which kills germs and bonds with the skin to continue killing germs even after washing.    Oral Hygiene is also important to reduce your risk of infection.  Remember - BRUSH YOUR TEETH THE MORNING OF SURGERY WITH YOUR REGULAR TOOTHPASTE  Please do not use if you have an allergy to CHG or antibacterial soaps. If your skin becomes reddened/irritated stop using the CHG.  Do not shave (including legs and underarms) for at least 48 hours prior to first CHG shower. It is OK to shave your face.  Please follow these instructions carefully.   1. Shower the NIGHT BEFORE SURGERY and the MORNING OF SURGERY with CHG Soap.   2. If you chose to  wash your hair, wash your hair first as usual with your normal shampoo.  3. After you shampoo, rinse your hair and body thoroughly to remove the shampoo.  4. Use CHG as you would any other liquid soap. You can apply CHG directly to the skin and wash gently with a scrungie or a clean washcloth.   5. Apply the CHG Soap to your body ONLY FROM THE NECK DOWN.  Do not use on open wounds or open sores. Avoid contact with your eyes, ears, mouth and genitals (private parts). Wash Face and genitals (private parts)  with your normal soap.   6. Wash thoroughly, paying special attention to the area where your surgery will be performed.  7. Thoroughly rinse your body with warm water from the neck down.  8. DO NOT shower/wash with your normal soap after using and rinsing off the CHG Soap.  9. Pat  yourself dry with a CLEAN TOWEL.  10. Wear CLEAN PAJAMAS to bed the night before surgery  11. Place CLEAN SHEETS on your bed the night of your first shower and DO NOT SLEEP WITH PETS.   Day of Surgery: Wear Clean/Comfortable clothing the morning of surgery Do not apply any deodorants/lotions.   Remember to brush your teeth WITH YOUR REGULAR TOOTHPASTE.   Please read over the following fact sheets that you were given.

## 2019-12-21 ENCOUNTER — Encounter (HOSPITAL_COMMUNITY)
Admission: RE | Admit: 2019-12-21 | Discharge: 2019-12-21 | Disposition: A | Discharge status: Home / Self Care | Payer: BC Managed Care – PPO | Source: Ambulatory Visit | Attending: Obstetrics and Gynecology | Admitting: Obstetrics and Gynecology

## 2019-12-21 ENCOUNTER — Other Ambulatory Visit: Payer: Self-pay

## 2019-12-21 ENCOUNTER — Other Ambulatory Visit (HOSPITAL_COMMUNITY): Payer: BC Managed Care – PPO

## 2019-12-21 ENCOUNTER — Encounter (HOSPITAL_COMMUNITY): Payer: Self-pay

## 2019-12-21 DIAGNOSIS — R0600 Dyspnea, unspecified: Secondary | ICD-10-CM | POA: Diagnosis not present

## 2019-12-21 DIAGNOSIS — Z79899 Other long term (current) drug therapy: Secondary | ICD-10-CM | POA: Diagnosis not present

## 2019-12-21 DIAGNOSIS — I10 Essential (primary) hypertension: Secondary | ICD-10-CM | POA: Diagnosis not present

## 2019-12-21 DIAGNOSIS — N83201 Unspecified ovarian cyst, right side: Secondary | ICD-10-CM | POA: Diagnosis not present

## 2019-12-21 DIAGNOSIS — E039 Hypothyroidism, unspecified: Secondary | ICD-10-CM | POA: Diagnosis not present

## 2019-12-21 DIAGNOSIS — Z01818 Encounter for other preprocedural examination: Secondary | ICD-10-CM | POA: Diagnosis not present

## 2019-12-21 DIAGNOSIS — Z87891 Personal history of nicotine dependence: Secondary | ICD-10-CM | POA: Diagnosis not present

## 2019-12-21 LAB — COMPREHENSIVE METABOLIC PANEL
ALT: 24 U/L (ref 0–44)
AST: 17 U/L (ref 15–41)
Albumin: 4.3 g/dL (ref 3.5–5.0)
Alkaline Phosphatase: 61 U/L (ref 38–126)
Anion gap: 8 (ref 5–15)
BUN: 10 mg/dL (ref 6–20)
CO2: 29 mmol/L (ref 22–32)
Calcium: 9.5 mg/dL (ref 8.9–10.3)
Chloride: 102 mmol/L (ref 98–111)
Creatinine, Ser: 0.65 mg/dL (ref 0.44–1.00)
GFR calc Af Amer: >60 mL/min (ref >60)
GFR calc non Af Amer: >60 mL/min (ref >60)
Glucose, Bld: 100 mg/dL — ABNORMAL HIGH (ref 70–99)
Potassium: 3.5 mmol/L (ref 3.5–5.1)
Sodium: 139 mmol/L (ref 135–145)
Total Bilirubin: 0.4 mg/dL (ref 0.3–1.2)
Total Protein: 7.6 g/dL (ref 6.5–8.1)

## 2019-12-21 LAB — CBC
HCT: 42.1 % (ref 36.0–46.0)
Hemoglobin: 13.5 g/dL (ref 12.0–15.0)
MCH: 27.9 pg (ref 26.0–34.0)
MCHC: 32.1 g/dL (ref 30.0–36.0)
MCV: 87 fL (ref 80.0–100.0)
Platelets: 384 10*3/uL (ref 150–400)
RBC: 4.84 MIL/uL (ref 3.87–5.11)
RDW: 12.4 % (ref 11.5–15.5)
WBC: 20.7 10*3/uL — ABNORMAL HIGH (ref 4.0–10.5)
nRBC: 0 % (ref 0.0–0.2)

## 2019-12-21 LAB — TYPE AND SCREEN
ABO/RH(D): O NEG
Antibody Screen: NEGATIVE

## 2019-12-21 NOTE — Progress Notes (Addendum)
PCP - Dr. Lesia Hausen with [facility] in [facility] Cardiologist - Denies  Chest x-ray - Not indicated EKG - 12/21/19 Stress Test - Denies  ECHO - Denies Cardiac Cath - Denies  No OSA or DM  ERAS Protcol -Instructions given  COVID TEST- Wednesday 18th  Anesthesia review:Yes WBC 20.7  Patient denies shortness of breath, fever, cough and chest pain at PAT appointment   All instructions explained to the patient, with a verbal understanding of the material. Patient agrees to go over the instructions while at home for a better understanding. Patient also instructed to self quarantine after being tested for COVID-19. The opportunity to ask questions was provided.  Patient had two areas with healing shingles (R hip and R lower back).  Patient stated her surgeon's office was aware.  I also called surgeon's office to be sure, spoke to scheduler [facility].

## 2019-12-21 NOTE — Progress Notes (Addendum)
Anesthesia Chart Review:  Case: [id number] Date/Time: 12/24/19 0715   Procedure: LAPAROSCOPIC RIGHT  SALPINGO OOPHORECTOMY POSSIBLE SALPINGECTOMY (Right )   Anesthesia type: General   Pre-op diagnosis: right ovarian cyst   Location: [facility] OR ROOM 07 / [place] [facility]   Surgeons: Everlene Farrier, MD      DISCUSSION: Patient is a 34 year old female scheduled for the above procedure.  History includes former smoker (quit 06/08/11), anxiety with panic attacks, HTN, dyspnea (uses albuterol as needed), hypothyroidism, morbid obesity.  She was seen at [facility] on 12/19/19 with 4-7 day history of rash involving the right hip and lower back. Rash was characterized by blistering and pain and felt consistent with shingles (Herpes Zoster). She was prescribed Valtrex  and Medrol 4mg  dose pack (both for 12/19/19-12/26/19).    She denied shortness of breath, cough, fever, chest pain at PAT RN visit.  Her WBC was elevated at 20.7K, possibly related to recent shingles and Medrol. PAT RN described areas as "healing shingles" (involving 2 dermatomes) and stated that no vesicles present and no drainage. Reported rash was no longer painful to touch. On 12/21/19, I notified Mary at Dr. Sherlynn Stalls office of leukocytosis in setting of shingles and steroids. She will review with Dr. Gaetano Net for any additional recommendations. (UPDATE 12/22/19 12:27 PM: Dr. Gaetano Net is moving surgery out to 01/19/20.)  Presurgical COVID-19 test is scheduled for 12/23/19.  She needs a preoperative urine pregnancy test.   VS: BP (!) 135/91   Pulse 86   Temp 36.6 C (Oral)   Resp 18   Ht 5' 5.5" (1.664 m)   Wt (!) 141.7 kg   LMP 12/12/2019   SpO2 99%   BMI 51.18 kg/m   PROVIDERS: Deborah Chalk, FNP is PCP ([place])   LABS: Preoperative labs noted. See DISCUSSION. (all labs ordered are listed, but only abnormal results are displayed)  Labs Reviewed  CBC - Abnormal; Notable for the following components:      Result Value   WBC  20.7 (*)    All other components within normal limits  COMPREHENSIVE METABOLIC PANEL - Abnormal; Notable for the following components:   Glucose, Bld 100 (*)    All other components within normal limits  TYPE AND SCREEN    EKG: 12/21/19: NSR   CV: Denied prior stress test, echocardiogram, cardiac cath.  Past Medical History:  Diagnosis Date  . Anxiety   . Carpal tunnel syndrome    right hand  . Dyspnea    uses albuterol inhaler with patient gets sick  . Environmental and seasonal allergies   . History of panic attacks   . Hypertension 2019  . Hypothyroid   . Obese   . Seasonal allergies     Past Surgical History:  Procedure Laterality Date  . CESAREAN SECTION N/A 06/23/2015   Procedure: CESAREAN SECTION;  Surgeon: Marylynn Pearson, MD;  Location: [place] [facility];  Service: Obstetrics;  Laterality: N/A;  . CHOLECYSTECTOMY    . HYSTEROSCOPY WITH D & C N/A 12/03/2016   Procedure: DILATATION AND CURETTAGE /HYSTEROSCOPY;  Surgeon: Everlene Farrier, MD;  Location: [place] [facility];  Service: Gynecology;  Laterality: N/A;  . INTRAUTERINE DEVICE (IUD) INSERTION N/A 12/03/2016   Procedure: INTRAUTERINE DEVICE (IUD) INSERTION;  Surgeon: Everlene Farrier, MD;  Location: [place] [facility];  Service: Gynecology;  Laterality: N/A;  MD BRINGING IUD FROM OFFICE Mirena   Lot # [id number]  Exp 05/2019  . MINOR CARPAL TUNNEL Left   . WISDOM TOOTH EXTRACTION  MEDICATIONS: . EPINEPHrine (EPIPEN 2-PAK) 0.3 mg/0.3 mL IJ SOAJ injection  . fluticasone (FLONASE) 50 MCG/ACT nasal spray  . labetalol (NORMODYNE) 100 MG tablet  . levothyroxine (SYNTHROID, LEVOTHROID) 25 MCG tablet  . loratadine (CLARITIN) 10 MG tablet  . montelukast (SINGULAIR) 10 MG tablet  . Olopatadine HCl (PATANASE) 0.6 % SOLN  . Prenatal Vit-Fe Fumarate-FA (PRENATAL MULTIVITAMIN) TABS tablet   No current facility-administered medications for this encounter.    Myra Gianotti, PA-C Surgical Short Stay/Anesthesiology [facility] Phone [phone] [facility] Phone  [phone] 12/22/2019 9:59 AM

## 2019-12-21 NOTE — Progress Notes (Signed)
Patient no call no show for pre op appt.  Tried calling [phone] with no answer and unable to leave a message.

## 2019-12-22 NOTE — Anesthesia Preprocedure Evaluation (Addendum)
Anesthesia Evaluation  Patient identified by MRN, date of birth, ID band Patient awake    Reviewed: Allergy & Precautions, NPO status , Patient's Chart, lab work & pertinent test results  Airway Mallampati: II  TM Distance: >3 FB Neck ROM: Full    Dental  (+) Dental Advisory Given   Pulmonary former smoker,    breath sounds clear to auscultation       Cardiovascular hypertension, Pt. on medications and Pt. on home beta blockers  Rhythm:Regular Rate:Normal     Neuro/Psych    GI/Hepatic negative GI ROS, Neg liver ROS,   Endo/Other  Hypothyroidism Morbid obesity  Renal/GU negative Renal ROS     Musculoskeletal   Abdominal   Peds  Hematology   Anesthesia Other Findings   Reproductive/Obstetrics                           Anesthesia Physical Anesthesia Plan  ASA: III  Anesthesia Plan: General   Post-op Pain Management:    Induction: Intravenous  PONV Risk Score and Plan: 4 or greater and Dexamethasone, Ondansetron, Midazolam, Scopolamine patch - Pre-op and Treatment may vary due to age or medical condition  Airway Management Planned:   Additional Equipment: None  Intra-op Plan:   Post-operative Plan: Extubation in OR  Informed Consent: I have reviewed the patients History and Physical, chart, labs and discussed the procedure including the risks, benefits and alternatives for the proposed anesthesia with the patient or authorized representative who has indicated his/her understanding and acceptance.     Dental advisory given  Plan Discussed with: CRNA  Anesthesia Plan Comments: (See PAT note written by Myra Gianotti, PA-C. Prescribed Valtrex and Medrol pack 12/19/19 for shingles, case to be rescheduled for 01/19/20.  )     Anesthesia Quick Evaluation

## 2019-12-23 ENCOUNTER — Inpatient Hospital Stay (HOSPITAL_COMMUNITY): Admission: RE | Admit: 2019-12-23 | Payer: BC Managed Care – PPO | Source: Ambulatory Visit

## 2020-01-15 ENCOUNTER — Other Ambulatory Visit: Payer: Self-pay

## 2020-01-15 ENCOUNTER — Encounter (HOSPITAL_COMMUNITY): Payer: Self-pay | Admitting: Obstetrics and Gynecology

## 2020-01-15 NOTE — Progress Notes (Signed)
Spoke with pt for pre-op call. Pt had a PAT appt on 12/21/19 for same surgery. It was cancelled due to pt having shingles. Pt states shingles are all dried up and gone. Pt states nothing has changed with her medical and surgical history.  Covid test is scheduled for 01/18/20. Pt understands that she will need to quarantine after getting test until she comes to the hospital on Tuesday.

## 2020-01-18 ENCOUNTER — Other Ambulatory Visit (HOSPITAL_COMMUNITY)
Admission: RE | Admit: 2020-01-18 | Discharge: 2020-01-18 | Disposition: A | Discharge status: Home / Self Care | Payer: BC Managed Care – PPO | Source: Ambulatory Visit | Attending: Obstetrics and Gynecology | Admitting: Obstetrics and Gynecology

## 2020-01-18 DIAGNOSIS — Z20822 Contact with and (suspected) exposure to covid-19: Secondary | ICD-10-CM | POA: Diagnosis not present

## 2020-01-18 DIAGNOSIS — Z01812 Encounter for preprocedural laboratory examination: Secondary | ICD-10-CM | POA: Insufficient documentation

## 2020-01-18 LAB — SARS CORONAVIRUS 2 (TAT 6-24 HRS): SARS Coronavirus 2: NEGATIVE

## 2020-01-18 MED ORDER — DEXTROSE 5 % IV SOLN
3.0000 g | INTRAVENOUS | Status: AC
  Administered 2020-01-19: 07:00:00 3 g via INTRAVENOUS
  Filled 2020-01-18: qty 3

## 2020-01-18 NOTE — H&P (Signed)
[name] [name] is an 34 y.o. female. [name] [name] had pelvic pain leading to an office U/S on 01/20/19. Bilat 2.6 and 3.5 cm simple ovarian cysts were noted on each ovary. The pain resolved and the cysts were followed with serial U/S. The left ovarian cysts resolved. However, the right ovarian cysts persisted. Office U/S on 12/10/19 noted right ovarian cyst 4.4 cm with 12 mm solid area and a 2.6 cm cyst with 13 mm solid area. U/S 12/16/19 is basically the same. CA-125 on 12/10/19 is normal at 20.1. Past studies have suggested obstruction of right tube and patent left tube. She conceived without difficulty before and wants to be pregnant again. Her aunt recently died with ovarian cancer. She was scheduled for surgery earlier and had an outbreak of shingles that is now resolved.  Pertinent Gynecological History: Menses: flow is moderate Bleeding:  Contraception: none DES exposure: denies Blood transfusions: none Sexually transmitted diseases: no past history Previous GYN Procedures: DNC  Last mammogram: Date:  Last pap: normal Date: 2019 OB History: G2, P1   Menstrual History: Menarche age: [name] [name] Patient's last menstrual period was 01/04/2020.    Past Medical History:  Diagnosis Date   Anxiety    Carpal tunnel syndrome    right hand   Dyspnea    uses albuterol inhaler with patient gets sick   Environmental and seasonal allergies    History of panic attacks    Hypertension 2019   Hypothyroid    Obese    Seasonal allergies    Shingles     Past Surgical History:  Procedure Laterality Date   CESAREAN SECTION N/A 06/23/2015   Procedure: CESAREAN SECTION;  Surgeon: Marylynn Pearson, MD;  Location: [place] [facility];  Service: Obstetrics;  Laterality: N/A;   CHOLECYSTECTOMY     HYSTEROSCOPY WITH D & C N/A 12/03/2016   Procedure: DILATATION AND CURETTAGE /HYSTEROSCOPY;  Surgeon: Everlene Farrier, MD;  Location: [place] [facility];  Service: Gynecology;  Laterality: N/A;   INTRAUTERINE DEVICE (IUD)  INSERTION N/A 12/03/2016   Procedure: INTRAUTERINE DEVICE (IUD) INSERTION;  Surgeon: Everlene Farrier, MD;  Location: [place] [facility];  Service: Gynecology;  Laterality: N/A;  MD BRINGING IUD FROM OFFICE Mirena   Lot # [id number]  Exp 05/2019   MINOR CARPAL TUNNEL Left    WISDOM TOOTH EXTRACTION      History reviewed. No pertinent family history.  Social History:  reports that she quit smoking about 8 years ago. Her smoking use included cigarettes. She has a 5.00 pack-year smoking history. She has quit using smokeless tobacco.  Her smokeless tobacco use included chew. She reports that she does not drink alcohol and does not use drugs.  Allergies:  Allergies  Allergen Reactions   Ibuprofen Hives   Sulfa Antibiotics Hives    No medications prior to admission.    Review of Systems  Constitutional: Negative for fever.    Last menstrual period 01/04/2020, unknown if currently breastfeeding. Physical Exam Cardiovascular:     Rate and Rhythm: Normal rate.  Pulmonary:     Effort: Pulmonary effort is normal.     Results for orders placed or performed during the hospital encounter of 01/18/20 (from the past 24 hour(s))  SARS CORONAVIRUS 2 (TAT 6-24 HRS) Nasopharyngeal Nasopharyngeal Swab     Status: None   Collection Time: 01/18/20  8:14 AM   Specimen: Nasopharyngeal Swab  Result Value Ref Range   SARS Coronavirus 2 NEGATIVE NEGATIVE    No results found.  Assessment/Plan: 34 yo with right ovarian cysts  Open L/S with right oophorectomy, probable RSO has been discussed. She wants to retain any possibly normal tubes. Risks discussed including infection, organ damage, bleeding/transfusion-HIV/Hep, DVT/PE, pneumonia, laparotomy.  [name] [name] 01/18/2020, 6:20 PM

## 2020-01-19 ENCOUNTER — Encounter (HOSPITAL_COMMUNITY)
Admission: RE | Disposition: A | Discharge status: Home / Self Care | Payer: Self-pay | Source: Home / Self Care | Attending: Obstetrics and Gynecology

## 2020-01-19 ENCOUNTER — Ambulatory Visit (HOSPITAL_COMMUNITY): Payer: BC Managed Care – PPO | Admitting: Anesthesiology

## 2020-01-19 ENCOUNTER — Ambulatory Visit (HOSPITAL_COMMUNITY): Payer: BC Managed Care – PPO | Admitting: Vascular Surgery

## 2020-01-19 ENCOUNTER — Ambulatory Visit (HOSPITAL_COMMUNITY)
Admission: RE | Admit: 2020-01-19 | Discharge: 2020-01-19 | Disposition: A | Discharge status: Home / Self Care | Payer: BC Managed Care – PPO | Attending: Obstetrics and Gynecology | Admitting: Obstetrics and Gynecology

## 2020-01-19 ENCOUNTER — Other Ambulatory Visit: Payer: Self-pay

## 2020-01-19 ENCOUNTER — Encounter (HOSPITAL_COMMUNITY): Payer: Self-pay | Admitting: Obstetrics and Gynecology

## 2020-01-19 DIAGNOSIS — D270 Benign neoplasm of right ovary: Secondary | ICD-10-CM | POA: Insufficient documentation

## 2020-01-19 DIAGNOSIS — Z8041 Family history of malignant neoplasm of ovary: Secondary | ICD-10-CM | POA: Insufficient documentation

## 2020-01-19 DIAGNOSIS — N736 Female pelvic peritoneal adhesions (postinfective): Secondary | ICD-10-CM | POA: Diagnosis not present

## 2020-01-19 DIAGNOSIS — Z882 Allergy status to sulfonamides status: Secondary | ICD-10-CM | POA: Insufficient documentation

## 2020-01-19 DIAGNOSIS — N83291 Other ovarian cyst, right side: Secondary | ICD-10-CM | POA: Diagnosis present

## 2020-01-19 DIAGNOSIS — Z6841 Body Mass Index (BMI) 40.0 and over, adult: Secondary | ICD-10-CM | POA: Insufficient documentation

## 2020-01-19 DIAGNOSIS — Z886 Allergy status to analgesic agent status: Secondary | ICD-10-CM | POA: Insufficient documentation

## 2020-01-19 DIAGNOSIS — Z87891 Personal history of nicotine dependence: Secondary | ICD-10-CM | POA: Insufficient documentation

## 2020-01-19 HISTORY — PX: LAPAROSCOPIC BILATERAL SALPINGO OOPHERECTOMY: SHX5890

## 2020-01-19 HISTORY — DX: Zoster without complications: B02.9

## 2020-01-19 HISTORY — PX: LAPAROSCOPIC LYSIS OF ADHESIONS: SHX5905

## 2020-01-19 LAB — TYPE AND SCREEN
ABO/RH(D): O NEG
Antibody Screen: NEGATIVE

## 2020-01-19 LAB — CBC
HCT: 40.1 % (ref 36.0–46.0)
Hemoglobin: 12.9 g/dL (ref 12.0–15.0)
MCH: 28 pg (ref 26.0–34.0)
MCHC: 32.2 g/dL (ref 30.0–36.0)
MCV: 87 fL (ref 80.0–100.0)
Platelets: 333 10*3/uL (ref 150–400)
RBC: 4.61 MIL/uL (ref 3.87–5.11)
RDW: 12.2 % (ref 11.5–15.5)
WBC: 10.1 10*3/uL (ref 4.0–10.5)
nRBC: 0 % (ref 0.0–0.2)

## 2020-01-19 LAB — COMPREHENSIVE METABOLIC PANEL
ALT: 25 U/L (ref 0–44)
AST: 19 U/L (ref 15–41)
Albumin: 4 g/dL (ref 3.5–5.0)
Alkaline Phosphatase: 57 U/L (ref 38–126)
Anion gap: 8 (ref 5–15)
BUN: 14 mg/dL (ref 6–20)
CO2: 25 mmol/L (ref 22–32)
Calcium: 9.4 mg/dL (ref 8.9–10.3)
Chloride: 104 mmol/L (ref 98–111)
Creatinine, Ser: 0.77 mg/dL (ref 0.44–1.00)
GFR calc Af Amer: >60 mL/min (ref >60)
GFR calc non Af Amer: >60 mL/min (ref >60)
Glucose, Bld: 97 mg/dL (ref 70–99)
Potassium: 4 mmol/L (ref 3.5–5.1)
Sodium: 137 mmol/L (ref 135–145)
Total Bilirubin: 0.4 mg/dL (ref 0.3–1.2)
Total Protein: 7 g/dL (ref 6.5–8.1)

## 2020-01-19 LAB — POCT PREGNANCY, URINE: Preg Test, Ur: NEGATIVE

## 2020-01-19 SURGERY — SALPINGO-OOPHORECTOMY, BILATERAL, LAPAROSCOPIC
Anesthesia: General | Laterality: Right

## 2020-01-19 MED ORDER — LACTATED RINGERS IV SOLN: INTRAVENOUS | Status: DC

## 2020-01-19 MED ORDER — PROPOFOL 10 MG/ML IV BOLUS
INTRAVENOUS | Status: DC | PRN
  Administered 2020-01-19: 200 mg via INTRAVENOUS

## 2020-01-19 MED ORDER — ROCURONIUM BROMIDE 10 MG/ML (PF) SYRINGE
PREFILLED_SYRINGE | INTRAVENOUS | Status: DC | PRN
  Administered 2020-01-19: 40 mg via INTRAVENOUS
  Administered 2020-01-19 (×2): 10 mg via INTRAVENOUS

## 2020-01-19 MED ORDER — SUGAMMADEX SODIUM 200 MG/2ML IV SOLN
INTRAVENOUS | Status: DC | PRN
  Administered 2020-01-19: 300 mg via INTRAVENOUS

## 2020-01-19 MED ORDER — FENTANYL CITRATE (PF) 250 MCG/5ML IJ SOLN
INTRAMUSCULAR | Status: AC
  Filled 2020-01-19: qty 5

## 2020-01-19 MED ORDER — FENTANYL CITRATE (PF) 250 MCG/5ML IJ SOLN
INTRAMUSCULAR | Status: DC | PRN
Start: 2020-01-19 — End: 2020-01-19
  Administered 2020-01-19 (×3): 50 ug via INTRAVENOUS

## 2020-01-19 MED ORDER — DEXAMETHASONE SODIUM PHOSPHATE 10 MG/ML IJ SOLN
INTRAMUSCULAR | Status: DC | PRN
  Administered 2020-01-19: 4 mg via INTRAVENOUS

## 2020-01-19 MED ORDER — AMISULPRIDE (ANTIEMETIC) 5 MG/2ML IV SOLN
INTRAVENOUS | Status: AC
  Filled 2020-01-19: qty 4

## 2020-01-19 MED ORDER — BUPIVACAINE HCL (PF) 0.5 % IJ SOLN
INTRAMUSCULAR | Status: AC
  Filled 2020-01-19: qty 30

## 2020-01-19 MED ORDER — ROCURONIUM BROMIDE 10 MG/ML (PF) SYRINGE
PREFILLED_SYRINGE | INTRAVENOUS | Status: AC
  Filled 2020-01-19: qty 10

## 2020-01-19 MED ORDER — PHENYLEPHRINE 40 MCG/ML (10ML) SYRINGE FOR IV PUSH (FOR BLOOD PRESSURE SUPPORT)
PREFILLED_SYRINGE | INTRAVENOUS | Status: AC
  Filled 2020-01-19: qty 10

## 2020-01-19 MED ORDER — PHENYLEPHRINE HCL-NACL 10-0.9 MG/250ML-% IV SOLN
INTRAVENOUS | Status: DC | PRN
  Administered 2020-01-19: 25 ug/min via INTRAVENOUS

## 2020-01-19 MED ORDER — SUCCINYLCHOLINE CHLORIDE 200 MG/10ML IV SOSY
PREFILLED_SYRINGE | INTRAVENOUS | Status: AC
  Filled 2020-01-19: qty 10

## 2020-01-19 MED ORDER — PHENYLEPHRINE 40 MCG/ML (10ML) SYRINGE FOR IV PUSH (FOR BLOOD PRESSURE SUPPORT)
PREFILLED_SYRINGE | INTRAVENOUS | Status: DC | PRN
  Administered 2020-01-19: 120 ug via INTRAVENOUS
  Administered 2020-01-19 (×2): 40 ug via INTRAVENOUS
  Administered 2020-01-19: 120 ug via INTRAVENOUS
  Administered 2020-01-19: 80 ug via INTRAVENOUS

## 2020-01-19 MED ORDER — OXYCODONE HCL 5 MG PO TABS: 5.0000 mg | ORAL_TABLET | Freq: Four times a day (QID) | ORAL | 0 refills | Status: DC | PRN

## 2020-01-19 MED ORDER — SUCCINYLCHOLINE CHLORIDE 200 MG/10ML IV SOSY
PREFILLED_SYRINGE | INTRAVENOUS | Status: DC | PRN
  Administered 2020-01-19: 120 mg via INTRAVENOUS

## 2020-01-19 MED ORDER — LIDOCAINE 2% (20 MG/ML) 5 ML SYRINGE
INTRAMUSCULAR | Status: AC
  Filled 2020-01-19: qty 5

## 2020-01-19 MED ORDER — POVIDONE-IODINE 10 % EX SWAB: 2.0000 "application " | Freq: Once | CUTANEOUS | Status: DC

## 2020-01-19 MED ORDER — MIDAZOLAM HCL 2 MG/2ML IJ SOLN
INTRAMUSCULAR | Status: AC
  Filled 2020-01-19: qty 2

## 2020-01-19 MED ORDER — BUPIVACAINE HCL (PF) 0.5 % IJ SOLN
INTRAMUSCULAR | Status: DC | PRN
  Administered 2020-01-19: 10 mL

## 2020-01-19 MED ORDER — PROPOFOL 10 MG/ML IV BOLUS
INTRAVENOUS | Status: AC
  Filled 2020-01-19: qty 20

## 2020-01-19 MED ORDER — FENTANYL CITRATE (PF) 100 MCG/2ML IJ SOLN
INTRAMUSCULAR | Status: AC
  Filled 2020-01-19: qty 2

## 2020-01-19 MED ORDER — SCOPOLAMINE 1 MG/3DAYS TD PT72
1.0000 | MEDICATED_PATCH | TRANSDERMAL | Status: DC
  Administered 2020-01-19: 1.5 mg via TRANSDERMAL
  Filled 2020-01-19: qty 1

## 2020-01-19 MED ORDER — ORAL CARE MOUTH RINSE: 15.0000 mL | Freq: Once | OROMUCOSAL | Status: AC

## 2020-01-19 MED ORDER — MIDAZOLAM HCL 5 MG/5ML IJ SOLN
INTRAMUSCULAR | Status: DC | PRN
  Administered 2020-01-19: 2 mg via INTRAVENOUS

## 2020-01-19 MED ORDER — LIDOCAINE HCL (PF) 1 % IJ SOLN
INTRAMUSCULAR | Status: AC
  Filled 2020-01-19: qty 30

## 2020-01-19 MED ORDER — CHLORHEXIDINE GLUCONATE 0.12 % MT SOLN
15.0000 mL | Freq: Once | OROMUCOSAL | Status: AC
  Administered 2020-01-19: 15 mL via OROMUCOSAL
  Filled 2020-01-19: qty 15

## 2020-01-19 MED ORDER — LIDOCAINE 2% (20 MG/ML) 5 ML SYRINGE
INTRAMUSCULAR | Status: DC | PRN
  Administered 2020-01-19: 80 mg via INTRAVENOUS

## 2020-01-19 MED ORDER — ACETAMINOPHEN 500 MG PO TABS
1000.0000 mg | ORAL_TABLET | Freq: Once | ORAL | Status: AC
  Administered 2020-01-19: 1000 mg via ORAL
  Filled 2020-01-19: qty 2

## 2020-01-19 MED ORDER — SOD CITRATE-CITRIC ACID 500-334 MG/5ML PO SOLN
30.0000 mL | ORAL | Status: AC
  Administered 2020-01-19: 30 mL via ORAL
  Filled 2020-01-19: qty 30

## 2020-01-19 MED ORDER — AMISULPRIDE (ANTIEMETIC) 5 MG/2ML IV SOLN
10.0000 mg | Freq: Once | INTRAVENOUS | Status: AC | PRN
  Administered 2020-01-19: 10 mg via INTRAVENOUS

## 2020-01-19 MED ORDER — ONDANSETRON HCL 4 MG/2ML IJ SOLN
INTRAMUSCULAR | Status: DC | PRN
  Administered 2020-01-19: 4 mg via INTRAVENOUS

## 2020-01-19 MED ORDER — FENTANYL CITRATE (PF) 100 MCG/2ML IJ SOLN
25.0000 ug | INTRAMUSCULAR | Status: DC | PRN
  Administered 2020-01-19 (×2): 25 ug via INTRAVENOUS

## 2020-01-19 SURGICAL SUPPLY — 31 items
BARRIER ADHS 3X4 INTERCEED (GAUZE/BANDAGES/DRESSINGS) IMPLANT
CABLE HIGH FREQUENCY MONO STRZ (ELECTRODE) IMPLANT
CATH ROBINSON RED A/P 16FR (CATHETERS) ×3 IMPLANT
DERMABOND ADVANCED (GAUZE/BANDAGES/DRESSINGS) ×1
DERMABOND ADVANCED .7 DNX12 (GAUZE/BANDAGES/DRESSINGS) ×2 IMPLANT
GAUZE 4X4 16PLY RFD (DISPOSABLE) ×3 IMPLANT
GLOVE BIO SURGEON STRL SZ8 (GLOVE) ×6 IMPLANT
GLOVE BIOGEL PI IND STRL 7.0 (GLOVE) ×2 IMPLANT
GLOVE BIOGEL PI IND STRL 8 (GLOVE) ×2 IMPLANT
GLOVE BIOGEL PI INDICATOR 7.0 (GLOVE) ×1
GLOVE BIOGEL PI INDICATOR 8 (GLOVE) ×1
KIT TURNOVER KIT B (KITS) ×3 IMPLANT
NEEDLE INSUFFLATION 14GA 120MM (NEEDLE) ×3 IMPLANT
NS IRRIG 1000ML POUR BTL (IV SOLUTION) ×3 IMPLANT
PACK LAPAROSCOPY BASIN (CUSTOM PROCEDURE TRAY) ×3 IMPLANT
PACK TRENDGUARD 450 HYBRID PRO (MISCELLANEOUS) IMPLANT
POUCH SPECIMEN RETRIEVAL 10MM (ENDOMECHANICALS) IMPLANT
PROTECTOR NERVE ULNAR (MISCELLANEOUS) ×6 IMPLANT
SEALER TISSUE G2 CVD JAW 45CM (ENDOMECHANICALS) ×3 IMPLANT
SET IRRIG TUBING LAPAROSCOPIC (IRRIGATION / IRRIGATOR) IMPLANT
SET TUBE SMOKE EVAC HIGH FLOW (TUBING) ×3 IMPLANT
SLEEVE ENDOPATH XCEL 5M (ENDOMECHANICALS) IMPLANT
SUT VICRYL 0 UR6 27IN ABS (SUTURE) ×6 IMPLANT
SUT VICRYL 4-0 PS2 18IN ABS (SUTURE) ×3 IMPLANT
SYR 30ML LL (SYRINGE) IMPLANT
TOWEL GREEN STERILE FF (TOWEL DISPOSABLE) ×6 IMPLANT
TRENDGUARD 450 HYBRID PRO PACK (MISCELLANEOUS)
TROCAR BALLN GELPORT 12X130M (ENDOMECHANICALS) ×3 IMPLANT
TROCAR XCEL NON-BLD 11X100MML (ENDOMECHANICALS) ×3 IMPLANT
TROCAR XCEL NON-BLD 5MMX100MML (ENDOMECHANICALS) ×3 IMPLANT
WARMER LAPAROSCOPE (MISCELLANEOUS) ×3 IMPLANT

## 2020-01-19 NOTE — Anesthesia Procedure Notes (Signed)
Procedure Name: Intubation Date/Time: 01/19/2020 7:22 AM Performed by: Wilburn Cornelia, CRNA Pre-anesthesia Checklist: Patient identified, Emergency Drugs available, Suction available, Patient being monitored and Timeout performed Patient Re-evaluated:Patient Re-evaluated prior to induction Oxygen Delivery Method: Circle system utilized Preoxygenation: Pre-oxygenation with 100% oxygen Induction Type: IV induction Ventilation: Mask ventilation without difficulty Laryngoscope Size: Mac Grade View: Grade II Tube type: Oral Tube size: 7.0 mm Airway Equipment and Method: Stylet Placement Confirmation: positive ETCO2,  ETT inserted through vocal cords under direct vision,  CO2 detector and breath sounds checked- equal and bilateral Secured at: 21 cm Tube secured with: Tape Dental Injury: Teeth and Oropharynx as per pre-operative assessment

## 2020-01-19 NOTE — Anesthesia Postprocedure Evaluation (Signed)
Anesthesia Post Note  Patient: [name] [name]  Procedure(s) Performed: LAPAROSCOPIC RIGHT OOPHORECTOMY   (Right ) LYSIS OF ADHESIONS     Patient location during evaluation: PACU Anesthesia Type: General Level of consciousness: awake and alert Pain management: pain level controlled Vital Signs Assessment: post-procedure vital signs reviewed and stable Respiratory status: spontaneous breathing, nonlabored ventilation, respiratory function stable and patient connected to nasal cannula oxygen Cardiovascular status: blood pressure returned to baseline and stable Postop Assessment: no apparent nausea or vomiting Anesthetic complications: no   No complications documented.  Last Vitals:  Vitals:   01/19/20 0936 01/19/20 0951  BP: 124/89 134/85  Pulse: 77 79  Resp: 18 19  Temp:  (!) 36.1 C  SpO2: 95% 99%    Last Pain:  Vitals:   01/19/20 0951  TempSrc:   PainSc: 2                  Tiajuana Amass

## 2020-01-19 NOTE — Op Note (Signed)
NAME: [name], [name] MEDICAL RECORD [id number] ACCOUNT [id number] DATE OF BIRTH:1985/05/23 FACILITY: [facility] LOCATION: [facility] PHYSICIAN:Keilon Ressel E. Marqus Macphee II, MD  OPERATIVE REPORT  DATE OF PROCEDURE:  01/19/2020  PREOPERATIVE DIAGNOSIS:  Right ovarian cyst.  POSTOPERATIVE DIAGNOSIS: 1.  Right ovarian cyst. 2.  Abdominal adhesions.  PROCEDURE:  Open laparoscopy with right oophorectomy and lysis of adhesions.  SURGEON:  Everlene Farrier, MD   ANESTHESIA:  General with endotracheal intubation.  ESTIMATED BLOOD LOSS:  Drops.  SPECIMENS:  Right ovary to pathology.  INDICATIONS AND CONSENT:  This patient is a 34 year old patient with right ovarian cyst.  Details are dictated in the history and physical.  Potential risks and complications have been discussed preoperatively including but not limited to infection,  organ damage, bleeding requiring transfusion of blood products with HIV and hepatitis acquisition, DVT, PE, pneumonia, laparotomy, return to the operating room.  The patient states she understands and agrees and consent was signed on the chart.  FINDINGS:  There is a row of omental adhesions beginning just inferior and to the left of the umbilicus, laterally to the left pelvis of omental adhesions.  There was no bowel in this.  The pelvis, uterus is smooth in contour.  Posterior cul-de-sac is  clear.  Left ovary has a 1-2 cm smooth translucent cyst.  Left fallopian tube is normal with normal looking fimbria.  The right ovary is smooth, but with multiple nodular cystic change.  There were no adhesions on the ovary.  The right fallopian tube  appears normal.  DESCRIPTION OF PROCEDURE:  The patient was taken to the operating room where she was identified, placed in the dorsal supine position and general anesthesia was induced via endotracheal intubation.  She was placed in the dorsal lithotomy position.   Vagina was prepped with Betadine, bladder straight catheterized.  Abdomen was  prepped with ChloraPrep.  Hulka tenaculum was placed in the uterus as a manipulator.  Timeout was undertaken.  After 3 minute drying time, she was draped in sterile fashion.   The infraumbilical and suprapubic area injected with 10 mL of 0.5% plain Marcaine.  An infraumbilical incision was made and dissection was carried out in layers to the fascia, which was taken down carefully.  The angles were carefully marked with a 0  Vicryl at the 3 and 9 o'clock positions and held.  The peritoneum was then bluntly entered.  The disposable Hasson tenaculum was then placed, the balloon was inflated, and it was anchored down with the suture.  Pneumoperitoneum was induced with  observation being done with the laparoscope to assure proper placement.  A small suprapubic incision was made in the midline and a 5 mm trocar sleeve was placed under direct visualization without difficulty.  The above findings were noted.  Then, using  the EnSeal bipolar cutter cutting instrument the right ovary is taken down and then placed in the anterior cul-de-sac.  Excellent hemostasis was maintained and inspection reveals continued hemostasis on the pedicle at that point.  5 mm laparoscope was  then placed through the suprapubic incision and an EndoCatch was placed into the umbilical incision and the right ovary was retrieved through the umbilical incision with careful observation from below in the 5 scope.  The trocar sleeve was then replaced.   Pneumoperitoneum was reintroduced and the omental adhesions to the anterior abdominal wall were taken down under good visualization well clear of any bowel using the EnSeal.  Good hemostasis was again maintained.  Reinspection reveals excellent  hemostasis on  the ovarian pedicle.  Suprapubic trocar sleeve was removed and the umbilical trocar sleeve was removed reducing the pneumoperitoneum.  Under very careful visualization the fascia is closed with the held sutures and tied down.  Its multiple   inspection notes it to be clear of any underlying bowel.  This securely closes the fascia.  Skin was then closed with interrupted 2-0 Vicryl on both incisions and Dermabond was placed on both.  Hulka tenaculum was removed and no bleeding was noted.  All  counts were correct.  The patient was awakened and taken to recovery room in stable condition.  CN/NUANCE  D:01/19/2020 T:01/19/2020 JOB:[id number]

## 2020-01-19 NOTE — Transfer of Care (Signed)
Immediate Anesthesia Transfer of Care Note  Patient: [name] [name]  Procedure(s) Performed: LAPAROSCOPIC RIGHT OOPHORECTOMY   (Right ) LYSIS OF ADHESIONS  Patient Location: PACU  Anesthesia Type:General  Level of Consciousness: awake, alert  and oriented  Airway & Oxygen Therapy: Patient Spontanous Breathing and Patient connected to face mask oxygen  Post-op Assessment: Report given to RN and Post -op Vital signs reviewed and stable  Post vital signs: Reviewed and stable  Last Vitals:  Vitals Value Taken Time  BP 116/67 01/19/20 0851  Temp 36.3 C 01/19/20 0851  Pulse 80 01/19/20 0853  Resp 21 01/19/20 0853  SpO2 98 % 01/19/20 0853  Vitals shown include unvalidated device data.  Last Pain:  Vitals:   01/19/20 0633  TempSrc: Oral  PainSc:       Patients Stated Pain Goal: 5 (97/84/78 4128)  Complications: No complications documented.

## 2020-01-19 NOTE — Progress Notes (Signed)
01/19/2020  8:39 AM  PATIENT:  [name] [name]  34 y.o. female  PRE-OPERATIVE DIAGNOSIS:  right ovarian cyst  POST-OPERATIVE DIAGNOSIS:  right ovarian cyst, abdominal adhesions  PROCEDURE:  Procedure(s): LAPAROSCOPIC RIGHT OOPHORECTOMY   (Right) LYSIS OF ADHESIONS  SURGEON:  Surgeon(s) and Role:    Everlene Farrier, MD - Primary  PHYSICIAN ASSISTANT:   ASSISTANTS: none   ANESTHESIA:   general  EBL:  5 mL   BLOOD ADMINISTERED:none  DRAINS: none   LOCAL MEDICATIONS USED:  MARCAINE    and Amount: 10 ml  SPECIMEN:  Source of Specimen:  right ovary  DISPOSITION OF SPECIMEN:  PATHOLOGY  COUNTS:  YES  TOURNIQUET:  * No tourniquets in log *  DICTATION: .Other Dictation: Dictation Number  [id number]  PLAN OF CARE: Discharge to home after PACU  PATIENT DISPOSITION:  PACU - hemodynamically stable.   Delay start of Pharmacological VTE agent (>24hrs) due to surgical blood loss or risk of bleeding: not applicable

## 2020-01-19 NOTE — Progress Notes (Signed)
No change to H&P per patient history. Reviewed procedure-L/S, right oophorectomy, possible RSO Allergies-ibuprofen>hives/swelling               -sulfa>hives/swelling All questions answered Patient states she understands and agrees

## 2020-01-20 ENCOUNTER — Encounter (HOSPITAL_COMMUNITY): Payer: Self-pay | Admitting: Obstetrics and Gynecology

## 2020-01-20 LAB — SURGICAL PATHOLOGY

## 2020-02-02 DIAGNOSIS — Z90721 Acquired absence of ovaries, unilateral: Secondary | ICD-10-CM | POA: Insufficient documentation

## 2020-05-13 DIAGNOSIS — D72828 Other elevated white blood cell count: Secondary | ICD-10-CM | POA: Insufficient documentation

## 2020-05-31 DIAGNOSIS — Z3490 Encounter for supervision of normal pregnancy, unspecified, unspecified trimester: Secondary | ICD-10-CM | POA: Insufficient documentation

## 2020-05-31 LAB — OB RESULTS CONSOLE HIV ANTIBODY (ROUTINE TESTING): HIV: NONREACTIVE

## 2020-05-31 LAB — OB RESULTS CONSOLE GC/CHLAMYDIA
Chlamydia: NEGATIVE
Gonorrhea: NEGATIVE

## 2020-05-31 LAB — OB RESULTS CONSOLE RUBELLA ANTIBODY, IGM: Rubella: IMMUNE

## 2020-05-31 LAB — OB RESULTS CONSOLE ABO/RH: RH Type: NEGATIVE

## 2020-05-31 LAB — OB RESULTS CONSOLE RPR: RPR: NONREACTIVE

## 2020-05-31 LAB — OB RESULTS CONSOLE HEPATITIS B SURFACE ANTIGEN: Hepatitis B Surface Ag: NEGATIVE

## 2020-05-31 LAB — OB RESULTS CONSOLE ANTIBODY SCREEN: Antibody Screen: NEGATIVE

## 2020-06-03 ENCOUNTER — Encounter (HOSPITAL_COMMUNITY): Payer: Self-pay | Admitting: Obstetrics and Gynecology

## 2020-06-03 ENCOUNTER — Inpatient Hospital Stay (HOSPITAL_COMMUNITY)
Admission: AD | Admit: 2020-06-03 | Discharge: 2020-06-03 | Disposition: A | Discharge status: Home / Self Care | Payer: BC Managed Care – PPO | Attending: Obstetrics and Gynecology | Admitting: Obstetrics and Gynecology

## 2020-06-03 ENCOUNTER — Other Ambulatory Visit: Payer: Self-pay

## 2020-06-03 ENCOUNTER — Inpatient Hospital Stay (HOSPITAL_COMMUNITY): Payer: BC Managed Care – PPO

## 2020-06-03 DIAGNOSIS — Z3A08 8 weeks gestation of pregnancy: Secondary | ICD-10-CM | POA: Diagnosis not present

## 2020-06-03 DIAGNOSIS — Z886 Allergy status to analgesic agent status: Secondary | ICD-10-CM | POA: Insufficient documentation

## 2020-06-03 DIAGNOSIS — O26899 Other specified pregnancy related conditions, unspecified trimester: Secondary | ICD-10-CM

## 2020-06-03 DIAGNOSIS — O200 Threatened abortion: Secondary | ICD-10-CM | POA: Diagnosis not present

## 2020-06-03 DIAGNOSIS — Z87891 Personal history of nicotine dependence: Secondary | ICD-10-CM | POA: Diagnosis not present

## 2020-06-03 DIAGNOSIS — O418X10 Other specified disorders of amniotic fluid and membranes, first trimester, not applicable or unspecified: Secondary | ICD-10-CM

## 2020-06-03 DIAGNOSIS — O99211 Obesity complicating pregnancy, first trimester: Secondary | ICD-10-CM | POA: Diagnosis not present

## 2020-06-03 DIAGNOSIS — O209 Hemorrhage in early pregnancy, unspecified: Secondary | ICD-10-CM

## 2020-06-03 DIAGNOSIS — O468X1 Other antepartum hemorrhage, first trimester: Secondary | ICD-10-CM | POA: Diagnosis not present

## 2020-06-03 DIAGNOSIS — Z79899 Other long term (current) drug therapy: Secondary | ICD-10-CM | POA: Diagnosis not present

## 2020-06-03 LAB — CBC
HCT: 38.5 % (ref 36.0–46.0)
Hemoglobin: 13.3 g/dL (ref 12.0–15.0)
MCH: 28.1 pg (ref 26.0–34.0)
MCHC: 34.5 g/dL (ref 30.0–36.0)
MCV: 81.2 fL (ref 80.0–100.0)
Platelets: 333 10*3/uL (ref 150–400)
RBC: 4.74 MIL/uL (ref 3.87–5.11)
RDW: 13.4 % (ref 11.5–15.5)
WBC: 17.8 10*3/uL — ABNORMAL HIGH (ref 4.0–10.5)
nRBC: 0 % (ref 0.0–0.2)

## 2020-06-03 LAB — HCG, QUANTITATIVE, PREGNANCY: hCG, Beta Chain, Quant, S: 145439 m[IU]/mL — ABNORMAL HIGH (ref <5)

## 2020-06-03 NOTE — MAU Note (Signed)
Pt states vaginal bleeding started at about 1530. When arriving at MAU, pt reported blood soaking through clothes. Brought back to room and undressed. Pt passed large amount of clots.  Had seen OB and had U/S with positive IUP earlier this month. Has small amount of cramping now.

## 2020-06-03 NOTE — Discharge Instructions (Signed)
Baby's heartbeat was 176 beats per minute <3   Return to MAU:  If you have heavier bleeding that soaks through more that 2 pads per hour for an hour or more  If you bleed so much that you feel like you might pass out or you do pass out  If you have significant abdominal pain that is not improved with Tylenol 1000 mg every 6 hours as needed for pain  If you develop a fever > 100.5

## 2020-06-03 NOTE — MAU Provider Note (Signed)
History     CSN: [id number]  Arrival date and time: 06/03/20 1555   Event Date/Time   First Provider Initiated Contact with Patient 06/03/20 1633      Chief Complaint  Patient presents with  . Vaginal Bleeding   [name] is a 35 y.o. year old [id number] female at [redacted]w[redacted]d weeks gestation who presents to MAU reporting she reports that she was going peed on herself and she wrenched down between her legs to protect her hands any blood pressure on her hands.  When she arrived MAU her clothes were soaked in blood and she passed a large blood clot that fell on the floor.  She is concerned that she is currently having a miscarriage.  She does report small amount of cramping; rated 3/10.  [facility] she was seen by them this week; had an ultrasound that showed an intrauterine pregnancy at 8 weeks with positive cardiac activity (180 bpm).   OB History    Gravida  2   Para  1   Term      Preterm  1   AB      Living  1     SAB      IAB      Ectopic      Multiple  0   Live Births  1           Past Medical History:  Diagnosis Date  . Anxiety   . Carpal tunnel syndrome    right hand  . Dyspnea    uses albuterol inhaler with patient gets sick  . Environmental and seasonal allergies   . History of panic attacks   . Hypertension 2019  . Hypothyroid   . Obese   . Seasonal allergies   . Shingles     Past Surgical History:  Procedure Laterality Date  . CESAREAN SECTION N/A 06/23/2015   Procedure: CESAREAN SECTION;  Surgeon: Marylynn Pearson, MD;  Location: [facility];  Service: Obstetrics;  Laterality: N/A;  . CHOLECYSTECTOMY    . HYSTEROSCOPY WITH D & C N/A 12/03/2016   Procedure: DILATATION AND CURETTAGE /HYSTEROSCOPY;  Surgeon: Everlene Farrier, MD;  Location: [facility];  Service: Gynecology;  Laterality: N/A;  . INTRAUTERINE DEVICE (IUD) INSERTION N/A 12/03/2016   Procedure: INTRAUTERINE DEVICE (IUD) INSERTION;  Surgeon: Everlene Farrier, MD;  Location: [facility];  Service: Gynecology;  Laterality: N/A;  MD BRINGING IUD FROM OFFICE Mirena   Lot # [id number]  Exp 05/2019  . LAPAROSCOPIC BILATERAL SALPINGO OOPHERECTOMY Right 01/19/2020   Procedure: LAPAROSCOPIC RIGHT OOPHORECTOMY  ;  Surgeon: Everlene Farrier, MD;  Location: [place];  Service: Gynecology;  Laterality: Right;  . LAPAROSCOPIC LYSIS OF ADHESIONS  01/19/2020   Procedure: LYSIS OF ADHESIONS;  Surgeon: Everlene Farrier, MD;  Location: [place];  Service: Gynecology;;  . MINOR CARPAL TUNNEL Left   . WISDOM TOOTH EXTRACTION      History reviewed. No pertinent family history.  Social History   Tobacco Use  . Smoking status: Former Smoker    Packs/day: 0.50    Years: 10.00    Pack years: 5.00    Types: Cigarettes    Quit date: 06/08/2011    Years since quitting: 8.9  . Smokeless tobacco: Former Systems developer    Types: Secondary school teacher  . Vaping Use: Never used  Substance Use Topics  . Alcohol use: No  . Drug use: No    Allergies:  Allergies  Allergen Reactions  . Ibuprofen Hives  . Sulfa Antibiotics Hives    Medications Prior to Admission  Medication Sig Dispense Refill Last Dose  . fluticasone (FLONASE) 50 MCG/ACT nasal spray Place 2 sprays into both nostrils in the morning and at bedtime.   Past Week at Unknown time  . labetalol (NORMODYNE) 100 MG tablet Take 100 mg by mouth 2 (two) times daily.   06/03/2020 at Unknown time  . levothyroxine (SYNTHROID, LEVOTHROID) 25 MCG tablet Take 25 mcg by mouth daily before breakfast.   06/03/2020 at Unknown time  . loratadine (CLARITIN) 10 MG tablet Take 10 mg by mouth daily.   06/03/2020 at Unknown time  . montelukast (SINGULAIR) 10 MG tablet Take 10 mg by mouth at bedtime.   06/02/2020 at Unknown time  . Olopatadine HCl (PATANASE) 0.6 % SOLN Place 1 drop into both nostrils 2 (two) times daily.    Past Week at Unknown time  . Prenatal Vit-Fe Fumarate-FA (PRENATAL MULTIVITAMIN) TABS tablet Take 1 tablet by mouth daily at 12 noon.    06/03/2020 at Unknown time  . EPINEPHrine 0.3 mg/0.3 mL IJ SOAJ injection Inject 0.3 mg into the muscle once.   Unknown at Unknown time  . oxyCODONE (ROXICODONE) 5 MG immediate release tablet Take 1 tablet (5 mg total) by mouth every 6 (six) hours as needed for severe pain. 10 tablet 0     Review of Systems  Constitutional: Negative.   HENT: Negative.   Eyes: Negative.   Respiratory: Negative.   Cardiovascular: Negative.   Gastrointestinal: Negative.   Endocrine: Negative.   Genitourinary: Positive for pelvic pain (cramping) and vaginal bleeding (heavy, gushed out while driving; clothes soaked in blood upon arrival).  Musculoskeletal: Negative.   Skin: Negative.   Allergic/Immunologic: Negative.   Neurological: Negative.   Hematological: Negative.   Psychiatric/Behavioral: Negative.    Physical Exam   Blood pressure (!) 116/53, pulse 91, temperature 98.5 F (36.9 C), temperature source Oral, resp. rate 18, last menstrual period 01/04/2020, unknown if currently breastfeeding.  Physical Exam Vitals and nursing note reviewed. Exam conducted with a chaperone present.  Constitutional:      Appearance: Normal appearance. She is obese.  HENT:     Head: Normocephalic and atraumatic.  Cardiovascular:     Rate and Rhythm: Normal rate.  Pulmonary:     Effort: Pulmonary effort is normal.  Genitourinary:    General: Normal vulva.     Comments: Uterus: non-tender, SE: cervix is smooth, pink, no lesions, moderate amt of dark, red blood -- cervix: closed/long, no CMT or friability, no adnexal tenderness  large blood clot with ?tissue collected in small pathology container -- will hold until after ultrasound Musculoskeletal:        General: Normal range of motion.     Cervical back: Normal range of motion.  Skin:    General: Skin is warm and dry.  Neurological:     Mental Status: She is alert and oriented to person, place, and time.  Psychiatric:        Mood and Affect: Mood normal.         Behavior: Behavior normal.        Thought Content: Thought content normal.        Judgment: Judgment normal.     MAU Course  Procedures  MDM CBC HCG OB < 14 wks U/S  Results for orders placed or performed during the hospital encounter of 06/03/20 (from the past 24 hour(s))  CBC  Status: Abnormal   Collection Time: 06/03/20  5:45 PM  Result Value Ref Range   WBC 17.8 (H) 4.0 - 10.5 K/uL   RBC 4.74 3.87 - 5.11 MIL/uL   Hemoglobin 13.3 12.0 - 15.0 g/dL   HCT 38.5 36.0 - 46.0 %   MCV 81.2 80.0 - 100.0 fL   MCH 28.1 26.0 - 34.0 pg   MCHC 34.5 30.0 - 36.0 g/dL   RDW 13.4 11.5 - 15.5 %   Platelets 333 150 - 400 K/uL   nRBC 0.0 0.0 - 0.2 %    Assessment and Plan  Subchorionic hematoma in first trimester, single or unspecified fetus  - Information provided on [facility], VB in pregnancy 1st trimester, threatened miscarriage, abdominal pain in pregnancy  - Return to MAU:  If you have heavier bleeding that soaks through more that 2 pads per hour for an hour or more  If you bleed so much that you feel like you might pass out or you do pass out  If you have significant abdominal pain that is not improved with Tylenol 1000 mg every 6 hours as needed for pain  If you develop a fever > 100.5 - Advised to call [facility] on Monday to see if they want her to f/U sooner than she is already scheduled for. - Advised to abstain from SI or any thing that will cause orgasm; absolutely no penetration at all - Discharge patient - Patient verbalized an understanding of the plan of care and agrees.   Laury Deep, CNM 06/03/2020, 4:33 PM

## 2020-09-04 ENCOUNTER — Encounter (HOSPITAL_COMMUNITY): Payer: Self-pay

## 2020-09-04 ENCOUNTER — Other Ambulatory Visit: Payer: Self-pay

## 2020-09-04 ENCOUNTER — Ambulatory Visit (HOSPITAL_COMMUNITY)
Admission: EM | Admit: 2020-09-04 | Discharge: 2020-09-04 | Disposition: A | Discharge status: Home / Self Care | Payer: BC Managed Care – PPO

## 2020-09-04 DIAGNOSIS — J069 Acute upper respiratory infection, unspecified: Secondary | ICD-10-CM | POA: Diagnosis not present

## 2020-09-04 DIAGNOSIS — J40 Bronchitis, not specified as acute or chronic: Secondary | ICD-10-CM

## 2020-09-04 NOTE — Discharge Instructions (Signed)
You most likely have viral illness.   Keep taking the Claritin, Flonase, and Patanase.  You can also use a netipot or nasal saline for nasal congestion.   You can take Tylenol and/or Ibuprofen as needed for fever reduction and pain relief.   For cough: honey 1/2 to 1 teaspoon (you can dilute the honey in water or another fluid).  You can use a humidifier for chest congestion and cough.  If you don't have a humidifier, you can sit in the bathroom with the hot shower running.    For sore throat: try warm salt water gargles, cepacol lozenges, throat spray, warm tea or water with lemon/honey, popsicles or ice, or OTC cold relief medicine for throat discomfort.    For congestion: take a daily anti-histamine like Zyrtec, Claritin, and a oral decongestant to help with post nasal drip that may be irritating your throat.    It is important to stay hydrated: drink plenty of fluids (water, gatorade/powerade/pedialyte, juices, or teas) to keep your throat moisturized and help further relieve irritation/discomfort.   Return or go to the Emergency Department if symptoms worsen or do not improve in the next few days.

## 2020-09-04 NOTE — ED Triage Notes (Addendum)
Pt present coughing with congestion and nasal drainage. Symptom started on Thursday but the cough became more productive last night. Pt states having tightness in her chest from cough up so much mucous

## 2020-09-04 NOTE — ED Provider Notes (Signed)
[place]    CSN: [id number] Arrival date & time: 09/04/20  1606      History   Chief Complaint Chief Complaint  Patient presents with  . Cough  . Nasal Congestion    HPI [name] [name] is a 35 y.o. female.   Patient here for cough and nasal congestion that have been ongoing for the past few days.  Patient is [redacted] weeks pregnant.  Reports taking Claritin, Patanase, and Flonase with some symptom relief.  No fevers at home. Denies any trauma, injury, or other precipitating event.  Denies any specific alleviating or aggravating factors.  Denies any fevers, chest pain, shortness of breath, N/V/D, numbness, tingling, weakness, abdominal pain, or headaches.   ROS: As per HPI, all other pertinent ROS negative   The history is provided by the patient.  Cough Associated symptoms: ear pain and sore throat     Past Medical History:  Diagnosis Date  . Anxiety   . Carpal tunnel syndrome    right hand  . Dyspnea    uses albuterol inhaler with patient gets sick  . Environmental and seasonal allergies   . History of panic attacks   . Hypertension 2019  . Hypothyroid   . Obese   . Seasonal allergies   . Shingles     Patient Active Problem List   Diagnosis Date Noted  . Umbilical cord prolapse 42/59/5638  . Preterm premature rupture of membranes (PPROM) with unknown onset of labor 06/19/2015    Past Surgical History:  Procedure Laterality Date  . CESAREAN SECTION N/A 06/23/2015   Procedure: CESAREAN SECTION;  Surgeon: Marylynn Pearson, MD;  Location: [facility];  Service: Obstetrics;  Laterality: N/A;  . CHOLECYSTECTOMY    . HYSTEROSCOPY WITH D & C N/A 12/03/2016   Procedure: DILATATION AND CURETTAGE /HYSTEROSCOPY;  Surgeon: Everlene Farrier, MD;  Location: [facility];  Service: Gynecology;  Laterality: N/A;  . INTRAUTERINE DEVICE (IUD) INSERTION N/A 12/03/2016   Procedure: INTRAUTERINE DEVICE (IUD) INSERTION;  Surgeon: Everlene Farrier, MD;  Location: [facility];  Service: Gynecology;   Laterality: N/A;  MD BRINGING IUD FROM OFFICE Mirena   Lot # [id number]  Exp 05/2019  . LAPAROSCOPIC BILATERAL SALPINGO OOPHERECTOMY Right 01/19/2020   Procedure: LAPAROSCOPIC RIGHT OOPHORECTOMY  ;  Surgeon: Everlene Farrier, MD;  Location: [place];  Service: Gynecology;  Laterality: Right;  . LAPAROSCOPIC LYSIS OF ADHESIONS  01/19/2020   Procedure: LYSIS OF ADHESIONS;  Surgeon: Everlene Farrier, MD;  Location: [place];  Service: Gynecology;;  . MINOR CARPAL TUNNEL Left   . WISDOM TOOTH EXTRACTION      OB History    Gravida  2   Para  1   Term      Preterm  1   AB      Living  1     SAB      IAB      Ectopic      Multiple  0   Live Births  1            Home Medications    Prior to Admission medications   Medication Sig Start Date End Date Taking? Authorizing Provider  EPINEPHrine 0.3 mg/0.3 mL IJ SOAJ injection Inject 0.3 mg into the muscle once.    [provider]  fluticasone (FLONASE) 50 MCG/ACT nasal spray Place 2 sprays into both nostrils in the morning and at bedtime.    [provider]  labetalol (NORMODYNE) 100 MG tablet Take 100 mg by  mouth 2 (two) times daily. 11/18/19   [provider]  levothyroxine (SYNTHROID, LEVOTHROID) 25 MCG tablet Take 25 mcg by mouth daily before breakfast.    [provider]  loratadine (CLARITIN) 10 MG tablet Take 10 mg by mouth daily.    [provider]  montelukast (SINGULAIR) 10 MG tablet Take 10 mg by mouth at bedtime.    [provider]  Olopatadine HCl (PATANASE) 0.6 % SOLN Place 1 drop into both nostrils 2 (two) times daily.     [provider]  oxyCODONE (ROXICODONE) 5 MG immediate release tablet Take 1 tablet (5 mg total) by mouth every 6 (six) hours as needed for severe pain. 01/19/20   Everlene Farrier, MD  Prenatal Vit-Fe Fumarate-FA (PRENATAL MULTIVITAMIN) TABS tablet Take 1 tablet by mouth daily at 12 noon.    [provider]    Family History History  reviewed. No pertinent family history.  Social History Social History   Tobacco Use  . Smoking status: Former Smoker    Packs/day: 0.50    Years: 10.00    Pack years: 5.00    Types: Cigarettes    Quit date: 06/08/2011    Years since quitting: 9.2  . Smokeless tobacco: Former Systems developer    Types: Secondary school teacher  . Vaping Use: Never used  Substance Use Topics  . Alcohol use: No  . Drug use: No     Allergies   Ibuprofen and Sulfa antibiotics   Review of Systems Review of Systems  HENT: Positive for congestion, ear pain and sore throat.   Respiratory: Positive for cough.   All other systems reviewed and are negative.    Physical Exam Triage Vital Signs ED Triage Vitals  Enc Vitals Group     BP 09/04/20 1645 129/82     Pulse Rate 09/04/20 1645 98     Resp 09/04/20 1645 18     Temp 09/04/20 1645 98.5 F (36.9 C)     Temp Source 09/04/20 1645 Oral     SpO2 09/04/20 1645 99 %     Weight --      Height --      Head Circumference --      Peak Flow --      Pain Score 09/04/20 1644 4     Pain Loc --      Pain Edu? --      Excl. in [place]? --    No data found.  Updated Vital Signs BP 129/82 (BP Location: Right Arm)   Pulse 98   Temp 98.5 F (36.9 C) (Oral)   Resp 18   LMP 01/04/2020   SpO2 99%   Visual Acuity Right Eye Distance:   Left Eye Distance:   Bilateral Distance:    Right Eye Near:   Left Eye Near:    Bilateral Near:     Physical Exam Vitals and nursing note reviewed.  Constitutional:      General: She is not in acute distress.    Appearance: Normal appearance. She is not ill-appearing, toxic-appearing or diaphoretic.  HENT:     Head: Normocephalic and atraumatic.     Right Ear: Tympanic membrane, ear canal and external ear normal.     Left Ear: Tympanic membrane, ear canal and external ear normal.     Nose: Congestion present.     Right Turbinates: Swollen.     Left Turbinates: Swollen.     Right Sinus: No maxillary sinus tenderness or frontal  sinus tenderness.     Left Sinus: No maxillary sinus tenderness or frontal sinus tenderness.     Mouth/Throat:     Pharynx: Posterior oropharyngeal erythema present. No pharyngeal swelling.     Tonsils: No tonsillar exudate or tonsillar abscesses. 0 on the right. 0 on the left.  Eyes:     Conjunctiva/sclera: Conjunctivae normal.  Cardiovascular:     Rate and Rhythm: Normal rate.     Pulses: Normal pulses.  Pulmonary:     Effort: Pulmonary effort is normal.  Abdominal:     General: Abdomen is flat.  Musculoskeletal:        General: Normal range of motion.     Cervical back: Normal range of motion.  Skin:    General: Skin is warm and dry.  Neurological:     General: No focal deficit present.     Mental Status: She is alert and oriented to person, place, and time.  Psychiatric:        Mood and Affect: Mood normal.      UC Treatments / Results  Labs (all labs ordered are listed, but only abnormal results are displayed) Labs Reviewed - No data to display  EKG   Radiology No results found.  Procedures Procedures (including critical care time)  Medications Ordered in UC Medications - No data to display  Initial Impression / Assessment and Plan / UC Course  I have reviewed the triage vital signs and the nursing notes.  Pertinent labs & imaging results that were available during my care of the patient were reviewed by me and considered in my medical decision making (see chart for details).    Assessment negative for red flags or concerns.  This is likely a viral URI or bronchitis.  Continue taking Claritin, Flonase, and Patanase as prescribed.  May also use Nettie pot or nasal saline for symptom management.  Conservative symptom management discussed as described below in discharge instructions.  Patient also given information sheet on medications that are safe to take during pregnancy.  Work note given to patient.  Follow-up with primary care as needed.  Final Clinical  Impressions(s) / UC Diagnoses   Final diagnoses:  Viral URI  Bronchitis     Discharge Instructions     You most likely have viral illness.   Keep taking the Claritin, Flonase, and Patanase.  You can also use a netipot or nasal saline for nasal congestion.   You can take Tylenol and/or Ibuprofen as needed for fever reduction and pain relief.   For cough: honey 1/2 to 1 teaspoon (you can dilute the honey in water or another fluid).  You can use a humidifier for chest congestion and cough.  If you don't have a humidifier, you can sit in the bathroom with the hot shower running.    For sore throat: try warm salt water gargles, cepacol lozenges, throat spray, warm tea or water with lemon/honey, popsicles or ice, or OTC cold relief medicine for throat discomfort.    For congestion: take a daily anti-histamine like Zyrtec, Claritin, and a oral decongestant to help with post nasal drip that may be irritating your throat.    It is important to stay hydrated: drink plenty of fluids (water, gatorade/powerade/pedialyte, juices, or teas) to keep your throat moisturized and help further relieve irritation/discomfort.   Return or go to the Emergency Department if symptoms worsen or do not improve in the next few days.      ED Prescriptions  None     PDMP not reviewed this encounter.   Pearson Forster, NP 09/04/20 1726

## 2020-11-28 ENCOUNTER — Inpatient Hospital Stay (HOSPITAL_COMMUNITY)
Admission: AD | Admit: 2020-11-28 | Discharge: 2020-11-28 | Disposition: A | Discharge status: Home / Self Care | Payer: BC Managed Care – PPO | Attending: Obstetrics & Gynecology | Admitting: Obstetrics & Gynecology

## 2020-11-28 ENCOUNTER — Other Ambulatory Visit: Payer: Self-pay

## 2020-11-28 DIAGNOSIS — Z87891 Personal history of nicotine dependence: Secondary | ICD-10-CM | POA: Insufficient documentation

## 2020-11-28 DIAGNOSIS — Z886 Allergy status to analgesic agent status: Secondary | ICD-10-CM | POA: Insufficient documentation

## 2020-11-28 DIAGNOSIS — O4703 False labor before 37 completed weeks of gestation, third trimester: Secondary | ICD-10-CM | POA: Insufficient documentation

## 2020-11-28 DIAGNOSIS — O479 False labor, unspecified: Secondary | ICD-10-CM

## 2020-11-28 DIAGNOSIS — Z3A33 33 weeks gestation of pregnancy: Secondary | ICD-10-CM | POA: Diagnosis not present

## 2020-11-28 DIAGNOSIS — Z882 Allergy status to sulfonamides status: Secondary | ICD-10-CM | POA: Diagnosis not present

## 2020-11-28 DIAGNOSIS — Z79899 Other long term (current) drug therapy: Secondary | ICD-10-CM | POA: Diagnosis not present

## 2020-11-28 DIAGNOSIS — O09213 Supervision of pregnancy with history of pre-term labor, third trimester: Secondary | ICD-10-CM

## 2020-11-28 LAB — URINALYSIS, ROUTINE W REFLEX MICROSCOPIC
Bilirubin Urine: NEGATIVE
Glucose, UA: NEGATIVE mg/dL
Hgb urine dipstick: NEGATIVE
Ketones, ur: NEGATIVE mg/dL
Leukocytes,Ua: NEGATIVE
Nitrite: NEGATIVE
Protein, ur: NEGATIVE mg/dL
Specific Gravity, Urine: 1.02 (ref 1.005–1.030)
pH: 5 (ref 5.0–8.0)

## 2020-11-28 MED ORDER — ACETAMINOPHEN 500 MG PO TABS
1000.0000 mg | ORAL_TABLET | Freq: Once | ORAL | Status: AC
  Administered 2020-11-28: 1000 mg via ORAL
  Filled 2020-11-28: qty 2

## 2020-11-28 MED ORDER — NIFEDIPINE 10 MG PO CAPS
10.0000 mg | ORAL_CAPSULE | Freq: Once | ORAL | Status: AC
  Administered 2020-11-28: 10 mg via ORAL
  Filled 2020-11-28: qty 1

## 2020-11-28 NOTE — MAU Provider Note (Signed)
History     CSN: [id number]  Arrival date and time: 11/28/20 1543   Event Date/Time   First Provider Initiated Contact with Patient 11/28/20 1718      No chief complaint on file.  HPI  Ms.[name] [name] is a 35 y.o. female G20P0101 @ 82w5dwith a history or previous preterm delivery @ 30 weeks, here with contractions. She reports  Cramping 4/10 that started lower in her abdomen at 11:00 this am today. The pain radiates to her left side. She did not try anything OCT. She reports some radiation to her Lower back that is worse than normal.   No bleeding + fetal movement.   OB History     Gravida  2   Para  1   Term      Preterm  1   AB      Living  1      SAB      IAB      Ectopic      Multiple  0   Live Births  1           Past Medical History:  Diagnosis Date   Anxiety    Carpal tunnel syndrome    right hand   Dyspnea    uses albuterol inhaler with patient gets sick   Environmental and seasonal allergies    History of panic attacks    Hypertension 2019   Hypothyroid    Obese    Seasonal allergies    Shingles     Past Surgical History:  Procedure Laterality Date   CESAREAN SECTION N/A 06/23/2015   Procedure: CESAREAN SECTION;  Surgeon: GMarylynn Pearson MD;  [facility];  Service: Obstetrics;  Laterality: N/A;   CHOLECYSTECTOMY     HYSTEROSCOPY WITH D & C N/A 12/03/2016   Procedure: DILATATION AND CURETTAGE /HYSTEROSCOPY;  Surgeon: TEverlene Farrier MD;  [facility];  Service: Gynecology;  Laterality: N/A;   INTRAUTERINE DEVICE (IUD) INSERTION N/A 12/03/2016   Procedure: INTRAUTERINE DEVICE (IUD) INSERTION;  Surgeon: TEverlene Farrier MD;  [facility];  Service: Gynecology;  Laterality: N/A;  MD BRINGING IUD FROM OFFICE Mirena   Lot # [id number] Exp 05/2019   LAPAROSCOPIC BILATERAL SALPINGO OOPHERECTOMY Right 01/19/2020   Procedure: LAPAROSCOPIC RIGHT OOPHORECTOMY  ;  Surgeon: TEverlene Farrier MD;  [place] [facility]  Service: Gynecology;   Laterality: Right;   LAPAROSCOPIC LYSIS OF ADHESIONS  01/19/2020   Procedure: LYSIS OF ADHESIONS;  Surgeon: TEverlene Farrier MD;  [place] [facility]  Service: Gynecology;;   MINOR CARPAL TUNNEL Left    WISDOM TOOTH EXTRACTION      No family history on file.  Social History   Tobacco Use   Smoking status: Former    Packs/day: 0.50    Years: 10.00    Pack years: 5.00    Types: Cigarettes    Quit date: 06/08/2011    Years since quitting: 9.4   Smokeless tobacco: Former    Types: CNurse, children'sUse: Never used  Substance Use Topics   Alcohol use: No   Drug use: No    Allergies:  Allergies  Allergen Reactions   Ibuprofen Hives   Sulfa Antibiotics Hives    Medications Prior to Admission  Medication Sig Dispense Refill Last Dose   labetalol (NORMODYNE) 100 MG tablet Take 100 mg by mouth 2 (two) times daily.   11/28/2020   levothyroxine (SYNTHROID, LEVOTHROID) 25 MCG tablet Take 25 mcg by  mouth daily before breakfast.   11/28/2020   loratadine (CLARITIN) 10 MG tablet Take 10 mg by mouth daily.   11/28/2020   Prenatal Vit-Fe Fumarate-FA (PRENATAL MULTIVITAMIN) TABS tablet Take 1 tablet by mouth daily at 12 noon.   11/28/2020   EPINEPHrine 0.3 mg/0.3 mL IJ SOAJ injection Inject 0.3 mg into the muscle once.      fluticasone (FLONASE) 50 MCG/ACT nasal spray Place 2 sprays into both nostrils in the morning and at bedtime.      montelukast (SINGULAIR) 10 MG tablet Take 10 mg by mouth at bedtime.      Olopatadine HCl (PATANASE) 0.6 % SOLN Place 1 drop into both nostrils 2 (two) times daily.       oxyCODONE (ROXICODONE) 5 MG immediate release tablet Take 1 tablet (5 mg total) by mouth every 6 (six) hours as needed for severe pain. 10 tablet 0     Review of Systems  Constitutional:  Negative for fever.  Gastrointestinal:  Positive for abdominal pain.  Musculoskeletal:  Positive for back pain.  Physical Exam   Blood pressure (!) 145/87, pulse (!) 105, temperature 98.2 F (36.8  C), temperature source Oral, resp. rate 20, height '5\' 6"'$  (1.676 m), weight (!) 152.4 kg, last menstrual period 01/04/2020, SpO2 99 %, unknown if currently breastfeeding.  Patient Vitals for the past 24 hrs:  BP Temp Temp src Pulse Resp SpO2 Height Weight  11/28/20 1904 (!) 114/59 98.7 F (37.1 C) Oral (!) 107 17 100 % -- --  11/28/20 1813 95/67 -- -- 99 -- 99 % -- --  11/28/20 1810 -- -- -- -- -- 98 % -- --  11/28/20 1805 -- -- -- -- -- 99 % -- --  11/28/20 1800 95/67 -- -- -- -- 98 % -- --  11/28/20 1755 -- -- -- -- -- 99 % -- --  11/28/20 1750 -- -- -- -- -- 99 % -- --  11/28/20 1745 -- -- -- -- -- 99 % -- --  11/28/20 1740 (!) 121/52 -- -- (!) 109 -- 95 % -- --  11/28/20 1738 (!) 121/52 -- -- -- -- -- -- --  11/28/20 1735 -- -- -- -- -- 98 % -- --  11/28/20 1730 -- -- -- -- -- 99 % -- --  11/28/20 1725 -- -- -- -- -- 98 % -- --  11/28/20 1720 -- -- -- -- -- 98 % -- --  11/28/20 1715 -- -- -- -- -- 98 % -- --  11/28/20 1710 -- -- -- -- -- 99 % -- --  11/28/20 1706 (!) 145/87 -- -- (!) 105 -- -- -- --  11/28/20 1705 -- -- -- -- -- 99 % -- --  11/28/20 1700 -- -- -- -- -- 98 % -- --  11/28/20 1623 132/85 98.2 F (36.8 C) Oral (!) 110 20 98 % '5\' 6"'$  (1.676 m) (!) 152.4 kg    Physical Exam Constitutional:      General: She is not in acute distress.    Appearance: Normal appearance. She is not ill-appearing, toxic-appearing or diaphoretic.  Pulmonary:     Effort: Pulmonary effort is normal.  Abdominal:     Palpations: Abdomen is soft.  Genitourinary:    Comments: Dilation: Fingertip Effacement (%):  (25) Cervical Position: Posterior Exam by:: Altamease Oiler, NP Neurological:     Mental Status: She is alert and oriented to person, place, and time.  Psychiatric:        Behavior: Behavior  normal.   Fetal Tracing: Baseline: 145 bpm Variability: Moderate  Accelerations: 15x15 Decelerations: None Toco:  None    Results for orders placed or performed during the hospital encounter  of 11/28/20 (from the past 48 hour(s))  Urinalysis, Routine w reflex microscopic Urine, Clean Catch     Status: Abnormal   Collection Time: 11/28/20  4:39 PM  Result Value Ref Range   Color, Urine YELLOW YELLOW   APPearance HAZY (A) CLEAR   Specific Gravity, Urine 1.020 1.005 - 1.030   pH 5.0 5.0 - 8.0   Glucose, UA NEGATIVE NEGATIVE mg/dL   Hgb urine dipstick NEGATIVE NEGATIVE   Bilirubin Urine NEGATIVE NEGATIVE   Ketones, ur NEGATIVE NEGATIVE mg/dL   Protein, ur NEGATIVE NEGATIVE mg/dL   Nitrite NEGATIVE NEGATIVE   Leukocytes,Ua NEGATIVE NEGATIVE    Comment: Performed at [facility], [address], [place], [place] [address]    MAU Course  Procedures None  MDM  Procardia 10 mg PO x 1 Oral hydration encouraged while in MAU.  Cervix unchanged from previous exam.   Assessment and Plan   A:  1. Braxton Hicks contractions   2. [redacted] weeks gestation of pregnancy      P:  Discharge home in stable condition Preter labor precautions Increase oral fluid intake at home Keep your OB visit on Wed Return to MAU if symptoms worsen  Gabriellah Rabel, Artist Pais, NP 11/28/2020 7:09 PM

## 2020-11-28 NOTE — MAU Note (Signed)
Having pelvic pain, feels like period cramps, going up left side of stomach and back, and across upper abd. Feels  Achy and uncomfortable.  Pt has hx of PTD.

## 2020-12-09 ENCOUNTER — Encounter (HOSPITAL_COMMUNITY): Payer: Self-pay

## 2020-12-09 NOTE — Patient Instructions (Addendum)
[name] [name]  12/09/2020   Your procedure is scheduled on:  12/23/2020  Arrive at [facility] [place] on [address] at [facility]  and [address]. You are invited to use the FREE valet parking or use the Visitor's parking deck.  Pick up the phone at the desk and dial [phone].  Call this number if you have problems the morning of surgery: [phone]  Remember:   Do not eat food:(After Midnight) Desps de medianoche.  Do not drink clear liquids: (After Midnight) Desps de medianoche.  Take these medicines the morning of surgery with A SIP OF WATER:  Take labetalol,levothyroxine and effexoras prescribed   Do not wear jewelry, make-up or nail polish.  Do not wear lotions, powders, or perfumes. Do not wear deodorant.  Do not shave 48 hours prior to surgery.  Do not bring valuables to the hospital.  [facility] is not   responsible for any belongings or valuables brought to the hospital.  Contacts, dentures or bridgework may not be worn into surgery.  Leave suitcase in the car. After surgery it may be brought to your room.  For patients admitted to the hospital, checkout time is 11:00 AM the day of              discharge.      Please read over the following fact sheets that you were given:     Preparing for Surgery

## 2020-12-13 ENCOUNTER — Inpatient Hospital Stay (HOSPITAL_COMMUNITY)
Admission: AD | Admit: 2020-12-13 | Discharge: 2020-12-13 | Disposition: A | Discharge status: Home / Self Care | Payer: BC Managed Care – PPO | Attending: Obstetrics & Gynecology | Admitting: Obstetrics & Gynecology

## 2020-12-13 ENCOUNTER — Other Ambulatory Visit: Payer: Self-pay

## 2020-12-13 ENCOUNTER — Encounter (HOSPITAL_COMMUNITY): Payer: Self-pay | Admitting: Obstetrics & Gynecology

## 2020-12-13 DIAGNOSIS — B9689 Other specified bacterial agents as the cause of diseases classified elsewhere: Secondary | ICD-10-CM | POA: Diagnosis not present

## 2020-12-13 DIAGNOSIS — R109 Unspecified abdominal pain: Secondary | ICD-10-CM | POA: Diagnosis present

## 2020-12-13 DIAGNOSIS — O23593 Infection of other part of genital tract in pregnancy, third trimester: Secondary | ICD-10-CM | POA: Insufficient documentation

## 2020-12-13 DIAGNOSIS — O10913 Unspecified pre-existing hypertension complicating pregnancy, third trimester: Secondary | ICD-10-CM

## 2020-12-13 DIAGNOSIS — O10919 Unspecified pre-existing hypertension complicating pregnancy, unspecified trimester: Secondary | ICD-10-CM

## 2020-12-13 DIAGNOSIS — N760 Acute vaginitis: Secondary | ICD-10-CM

## 2020-12-13 DIAGNOSIS — N858 Other specified noninflammatory disorders of uterus: Secondary | ICD-10-CM

## 2020-12-13 DIAGNOSIS — Z3A36 36 weeks gestation of pregnancy: Secondary | ICD-10-CM

## 2020-12-13 DIAGNOSIS — O10013 Pre-existing essential hypertension complicating pregnancy, third trimester: Secondary | ICD-10-CM | POA: Diagnosis not present

## 2020-12-13 DIAGNOSIS — O99891 Other specified diseases and conditions complicating pregnancy: Secondary | ICD-10-CM | POA: Diagnosis not present

## 2020-12-13 DIAGNOSIS — N898 Other specified noninflammatory disorders of vagina: Secondary | ICD-10-CM

## 2020-12-13 LAB — COMPREHENSIVE METABOLIC PANEL
ALT: 16 U/L (ref 0–44)
AST: 20 U/L (ref 15–41)
Albumin: 3 g/dL — ABNORMAL LOW (ref 3.5–5.0)
Alkaline Phosphatase: 92 U/L (ref 38–126)
Anion gap: 11 (ref 5–15)
BUN: 10 mg/dL (ref 6–20)
CO2: 24 mmol/L (ref 22–32)
Calcium: 9.5 mg/dL (ref 8.9–10.3)
Chloride: 103 mmol/L (ref 98–111)
Creatinine, Ser: 0.71 mg/dL (ref 0.44–1.00)
GFR, Estimated: >60 mL/min (ref >60)
Glucose, Bld: 91 mg/dL (ref 70–99)
Potassium: 3.8 mmol/L (ref 3.5–5.1)
Sodium: 138 mmol/L (ref 135–145)
Total Bilirubin: 0.4 mg/dL (ref 0.3–1.2)
Total Protein: 6.3 g/dL — ABNORMAL LOW (ref 6.5–8.1)

## 2020-12-13 LAB — CBC
HCT: 36.4 % (ref 36.0–46.0)
Hemoglobin: 12.3 g/dL (ref 12.0–15.0)
MCH: 29.7 pg (ref 26.0–34.0)
MCHC: 33.8 g/dL (ref 30.0–36.0)
MCV: 87.9 fL (ref 80.0–100.0)
Platelets: 291 10*3/uL (ref 150–400)
RBC: 4.14 MIL/uL (ref 3.87–5.11)
RDW: 13.5 % (ref 11.5–15.5)
WBC: 18.4 10*3/uL — ABNORMAL HIGH (ref 4.0–10.5)
nRBC: 0 % (ref 0.0–0.2)

## 2020-12-13 LAB — WET PREP, GENITAL
Sperm: NONE SEEN
Trich, Wet Prep: NONE SEEN
Yeast Wet Prep HPF POC: NONE SEEN

## 2020-12-13 LAB — URINALYSIS, ROUTINE W REFLEX MICROSCOPIC
Bilirubin Urine: NEGATIVE
Glucose, UA: NEGATIVE mg/dL
Hgb urine dipstick: NEGATIVE
Ketones, ur: 5 mg/dL — AB
Leukocytes,Ua: NEGATIVE
Nitrite: NEGATIVE
Protein, ur: NEGATIVE mg/dL
Specific Gravity, Urine: 1.019 (ref 1.005–1.030)
pH: 5 (ref 5.0–8.0)

## 2020-12-13 LAB — PROTEIN / CREATININE RATIO, URINE
Creatinine, Urine: 168.06 mg/dL
Protein Creatinine Ratio: 0.05 mg/mg{Cre} (ref 0.00–0.15)
Total Protein, Urine: 9 mg/dL

## 2020-12-13 LAB — AMNISURE RUPTURE OF MEMBRANE (ROM) NOT AT ARMC: Amnisure ROM: NEGATIVE

## 2020-12-13 MED ORDER — METRONIDAZOLE 500 MG PO TABS: 500.0000 mg | ORAL_TABLET | Freq: Two times a day (BID) | ORAL | 0 refills | Status: AC

## 2020-12-13 MED ORDER — LABETALOL HCL 100 MG PO TABS
100.0000 mg | ORAL_TABLET | Freq: Once | ORAL | Status: AC
  Administered 2020-12-13: 100 mg via ORAL
  Filled 2020-12-13: qty 1

## 2020-12-13 NOTE — MAU Provider Note (Signed)
Chief Complaint:  Abdominal Pain   Event Date/Time   First Provider Initiated Contact with Patient 12/13/20 2035     HPI: [name] [name] is a 34 y.o. G2P0101 at 46w6dho presents to maternity admissions reporting contractions, vaginal discharge, slight headache. She reports good fetal movement, denies LOF, vaginal bleeding, vaginal itching/burning, urinary symptoms, h/a, dizziness, n/v, diarrhea, constipation or fever/chills.  She denies visual changes or RUQ abdominal pain. Has a history of Classical C/S.  Also has chronic hypertension.  Abdominal Pain This is a new problem. The current episode started today. The onset quality is gradual. The problem occurs intermittently. The problem has been unchanged. The pain is mild. The quality of the pain is cramping. The abdominal pain does not radiate. Pertinent negatives include no constipation, diarrhea, dysuria, fever, frequency, headaches, myalgias, nausea or vomiting. Nothing aggravates the pain. The pain is relieved by Nothing. She has tried nothing for the symptoms.   RN Note: Pt reports increase in vaginal discharge for the last two days.  Pt reports for the last two hours she has felt abdominal cramping.  Denies vaginal bleeding          Reports +FM    Past Medical History: Past Medical History:  Diagnosis Date   Anxiety    Carpal tunnel syndrome    right hand   Dyspnea    uses albuterol inhaler with patient gets sick   Environmental and seasonal allergies    History of panic attacks    Hypertension 2019   Hypothyroid    Obese    Seasonal allergies    Shingles     Past obstetric history: OB History  Gravida Para Term Preterm AB Living  '2 1   1   1  '$ SAB IAB Ectopic Multiple Live Births        0 1    # Outcome Date GA Lbr Len/2nd Weight Sex Delivery Anes PTL Lv  2 Current           1 Preterm 06/23/15 [id number] g F CS-LTranv Spinal  LIV    Past Surgical History: Past Surgical History:  Procedure Laterality Date    CESAREAN SECTION N/A 06/23/2015   Procedure: CESAREAN SECTION;  Surgeon: GrMarylynn PearsonMD;  [facility];  Service: Obstetrics;  Laterality: N/A;   CHOLECYSTECTOMY     HYSTEROSCOPY WITH D & C N/A 12/03/2016   Procedure: DILATATION AND CURETTAGE /HYSTEROSCOPY;  Surgeon: ToEverlene FarrierMD;  [place];  Service: Gynecology;  Laterality: N/A;   INTRAUTERINE DEVICE (IUD) INSERTION N/A 12/03/2016   Procedure: INTRAUTERINE DEVICE (IUD) INSERTION;  Surgeon: ToEverlene FarrierMD;  [facility];  Service: Gynecology;  Laterality: N/A;  MD BRINGING IUD FROM OFFICE Mirena   Lot # [id number] 05/2019   LAPAROSCOPIC BILATERAL SALPINGO OOPHERECTOMY Right 01/19/2020   Procedure: LAPAROSCOPIC RIGHT OOPHORECTOMY  ;  Surgeon: ToEverlene FarrierMD;  [facility] Service: Gynecology;  Laterality: Right;   LAPAROSCOPIC LYSIS OF ADHESIONS  01/19/2020   Procedure: LYSIS OF ADHESIONS;  Surgeon: ToEverlene FarrierMD;  [facility];  Service: Gynecology;;   MINOR CARPAL TUNNEL Left    WISDOM TOOTH EXTRACTION      Family History: Family History  Problem Relation Age of Onset   Hypertension Mother    Anxiety disorder Father    Hypertension Father    Diabetes Father    Diabetes Paternal Aunt    Ovarian cancer Paternal Aunt     Social History: Social  History   Tobacco Use   Smoking status: Former    Packs/day: 0.50    Years: 10.00    Pack years: 5.00    Types: Cigarettes    Quit date: 06/08/2011    Years since quitting: 9.5   Smokeless tobacco: Former    Types: Nurse, children's Use: Never used  Substance Use Topics   Alcohol use: No   Drug use: No    Allergies:  Allergies  Allergen Reactions   Ibuprofen Hives   Sulfa Antibiotics Hives    Meds:  Medications Prior to Admission  Medication Sig Dispense Refill Last Dose   fluticasone (FLONASE) 50 MCG/ACT nasal spray Place 2 sprays into both nostrils in the morning and at bedtime.   12/13/2020   labetalol (NORMODYNE) 100  MG tablet Take 100 mg by mouth 2 (two) times daily.   12/13/2020   levothyroxine (SYNTHROID, LEVOTHROID) 25 MCG tablet Take 25 mcg by mouth daily before breakfast.   12/13/2020   loratadine (CLARITIN) 10 MG tablet Take 10 mg by mouth daily.   12/13/2020   montelukast (SINGULAIR) 10 MG tablet Take 10 mg by mouth at bedtime.   12/12/2020   Prenatal Vit-Fe Fumarate-FA (PRENATAL MULTIVITAMIN) TABS tablet Take 1 tablet by mouth daily at 12 noon.   12/12/2020   venlafaxine (EFFEXOR) 37.5 MG tablet Take 37.5 mg by mouth 2 (two) times daily.   12/13/2020   EPINEPHrine 0.3 mg/0.3 mL IJ SOAJ injection Inject 0.3 mg into the muscle once.      Olopatadine HCl (PATANASE) 0.6 % SOLN Place 1 drop into both nostrils 2 (two) times daily.        I have reviewed patient's Past Medical Hx, Surgical Hx, Family Hx, Social Hx, medications and allergies.   ROS:  Review of Systems  Constitutional:  Negative for fever.  Gastrointestinal:  Positive for abdominal pain. Negative for constipation, diarrhea, nausea and vomiting.  Genitourinary:  Negative for dysuria and frequency.  Musculoskeletal:  Negative for myalgias.  Neurological:  Negative for headaches.  Other systems negative  Physical Exam  Patient Vitals for the past 24 hrs:  BP Temp Temp src Pulse Resp SpO2  12/13/20 2030 137/62 -- -- (!) 116 -- 98 %  12/13/20 2015 (!) 142/59 -- -- (!) 109 -- 98 %  12/13/20 2000 (!) 150/75 -- -- (!) 101 -- 98 %  12/13/20 1945 (!) 146/75 -- -- (!) 101 -- 98 %  12/13/20 1931 136/79 -- -- (!) 105 -- --  12/13/20 1916 134/78 -- -- (!) 106 -- --  12/13/20 1901 (!) 144/86 -- -- (!) 110 -- --  12/13/20 1856 138/79 -- -- (!) 114 -- --  12/13/20 1853 -- 98.1 F (36.7 C) Oral -- 12 99 %   Constitutional: Well-developed, well-nourished female in no acute distress.  Cardiovascular: normal rate and rhythm Respiratory: normal effort, clear to auscultation bilaterally GI: Abd soft, non-tender, gravid appropriate for gestational age.   No  rebound or guarding. MS: Extremities nontender, no edema, normal ROM Neurologic: Alert and oriented x 4.  GU: Neg CVAT.  PELVIC EXAM:   Dilation: Closed Effacement (%): 40 Cervical Position: Middle Station: Ballotable Presentation: Vertex Exam by:: Jimmye Norman CNM  FHT:  Baseline 145 , moderate variability, accelerations present, no decelerations Contractions: Uterine irritability/mild contractions every 2-9mn   Labs: Results for orders placed or performed during the hospital encounter of 12/13/20 (from the past 24 hour(s))  CBC     Status: Abnormal  Collection Time: 12/13/20  9:03 PM  Result Value Ref Range   WBC 18.4 (H) 4.0 - 10.5 K/uL   RBC 4.14 3.87 - 5.11 MIL/uL   Hemoglobin 12.3 12.0 - 15.0 g/dL   HCT 36.4 36.0 - 46.0 %   MCV 87.9 80.0 - 100.0 fL   MCH 29.7 26.0 - 34.0 pg   MCHC 33.8 30.0 - 36.0 g/dL   RDW 13.5 11.5 - 15.5 %   Platelets 291 150 - 400 K/uL   nRBC 0.0 0.0 - 0.2 %  Comprehensive metabolic panel     Status: Abnormal   Collection Time: 12/13/20  9:03 PM  Result Value Ref Range   Sodium 138 135 - 145 mmol/L   Potassium 3.8 3.5 - 5.1 mmol/L   Chloride 103 98 - 111 mmol/L   CO2 24 22 - 32 mmol/L   Glucose, Bld 91 70 - 99 mg/dL   BUN 10 6 - 20 mg/dL   Creatinine, Ser 0.71 0.44 - 1.00 mg/dL   Calcium 9.5 8.9 - 10.3 mg/dL   Total Protein 6.3 (L) 6.5 - 8.1 g/dL   Albumin 3.0 (L) 3.5 - 5.0 g/dL   AST 20 15 - 41 U/L   ALT 16 0 - 44 U/L   Alkaline Phosphatase 92 38 - 126 U/L   Total Bilirubin 0.4 0.3 - 1.2 mg/dL   GFR, Estimated >60 >60 mL/min   Anion gap 11 5 - 15  Wet prep, genital     Status: Abnormal   Collection Time: 12/13/20  9:20 PM  Result Value Ref Range   Yeast Wet Prep HPF POC NONE SEEN NONE SEEN   Trich, Wet Prep NONE SEEN NONE SEEN   Clue Cells Wet Prep HPF POC PRESENT (A) NONE SEEN   WBC, Wet Prep HPF POC MANY (A) NONE SEEN   Sperm NONE SEEN   Protein / creatinine ratio, urine     Status: None   Collection Time: 12/13/20  9:20 PM   Result Value Ref Range   Creatinine, Urine 168.06 mg/dL   Total Protein, Urine 9 mg/dL   Protein Creatinine Ratio 0.05 0.00 - 0.15 mg/mg[Cre]  Amnisure rupture of membrane (rom)not at [facility]     Status: None   Collection Time: 12/13/20  9:20 PM  Result Value Ref Range   Amnisure ROM NEGATIVE   Urinalysis, Routine w reflex microscopic Urine, Clean Catch     Status: Abnormal   Collection Time: 12/13/20  9:24 PM  Result Value Ref Range   Color, Urine YELLOW YELLOW   APPearance HAZY (A) CLEAR   Specific Gravity, Urine 1.019 1.005 - 1.030   pH 5.0 5.0 - 8.0   Glucose, UA NEGATIVE NEGATIVE mg/dL   Hgb urine dipstick NEGATIVE NEGATIVE   Bilirubin Urine NEGATIVE NEGATIVE   Ketones, ur 5 (A) NEGATIVE mg/dL   Protein, ur NEGATIVE NEGATIVE mg/dL   Nitrite NEGATIVE NEGATIVE   Leukocytes,Ua NEGATIVE NEGATIVE    O/Negative/-- (01/25 0000)  Imaging:  No results found.  MAU Course/MDM: I have ordered labs and reviewed results. Preeclampsia labs ordered due to several elevated BPs approaching severe range.   Pt has chronic leukocytosis.  Normal PCR.   NST reviewed, reactive. She was due for her evening Labetalol, which we gave.  Her BPs improved significantly after this was administered.  Consult Drs Roselie Awkward and Dr Lynnette Caffey with presentation, exam findings and test results. Dr Lynnette Caffey states it is OK for patient to be discharged home.  Discussed BV, will  treat. Discussed strict labor precautions.  If she has more painful contractions, she is to return right away.   Assessment: Single IUP at.19w0dUterine irritability without cervical change, history of Classical C/S Chronic hypertension with normal preeclampsia labs Bacterial Vaginosis  Plan: Discharge home Rx Flagyl for BV Scheduled for C/S next week, strict labor precautions Labor precautions and fetal kick counts Follow up in Office for prenatal visits Encouraged to return if she develops worsening of symptoms, increase in pain, fever,  or other concerning symptoms.   Pt stable at time of discharge.  MHansel FeinsteinCNM, MSN Certified Nurse-Midwife 12/13/2020 8:35 PM

## 2020-12-13 NOTE — MAU Note (Signed)
Pt reports increase in vaginal discharge for the last two days.   Pt reports for the last two hours she has felt abdominal cramping.   Denies vaginal bleeding   Reports +FM

## 2020-12-14 ENCOUNTER — Encounter (HOSPITAL_COMMUNITY): Payer: Self-pay | Admitting: Obstetrics & Gynecology

## 2020-12-14 LAB — OB RESULTS CONSOLE GBS: GBS: POSITIVE

## 2020-12-21 ENCOUNTER — Encounter (HOSPITAL_COMMUNITY): Payer: Self-pay | Admitting: Obstetrics and Gynecology

## 2020-12-21 ENCOUNTER — Inpatient Hospital Stay (HOSPITAL_COMMUNITY): Payer: BC Managed Care – PPO | Admitting: Anesthesiology

## 2020-12-21 ENCOUNTER — Encounter (HOSPITAL_COMMUNITY)
Admission: AD | Disposition: A | Discharge status: Home / Self Care | Payer: Self-pay | Source: Home / Self Care | Attending: Obstetrics and Gynecology

## 2020-12-21 ENCOUNTER — Other Ambulatory Visit: Payer: Self-pay

## 2020-12-21 ENCOUNTER — Inpatient Hospital Stay (HOSPITAL_COMMUNITY)
Admission: AD | Admit: 2020-12-21 | Discharge: 2020-12-23 | DRG: 783 | Disposition: A | Discharge status: Home / Self Care | Payer: BC Managed Care – PPO | Attending: Obstetrics and Gynecology | Admitting: Obstetrics and Gynecology

## 2020-12-21 ENCOUNTER — Encounter (HOSPITAL_COMMUNITY)
Admission: RE | Admit: 2020-12-21 | Discharge: 2020-12-21 | Disposition: A | Discharge status: Home / Self Care | Payer: BC Managed Care – PPO | Source: Ambulatory Visit | Attending: Obstetrics and Gynecology | Admitting: Obstetrics and Gynecology

## 2020-12-21 DIAGNOSIS — O99824 Streptococcus B carrier state complicating childbirth: Secondary | ICD-10-CM | POA: Diagnosis present

## 2020-12-21 DIAGNOSIS — U071 COVID-19: Secondary | ICD-10-CM | POA: Diagnosis present

## 2020-12-21 DIAGNOSIS — Z3A37 37 weeks gestation of pregnancy: Secondary | ICD-10-CM | POA: Diagnosis not present

## 2020-12-21 DIAGNOSIS — O34212 Maternal care for vertical scar from previous cesarean delivery: Principal | ICD-10-CM | POA: Diagnosis present

## 2020-12-21 DIAGNOSIS — O1002 Pre-existing essential hypertension complicating childbirth: Secondary | ICD-10-CM | POA: Diagnosis present

## 2020-12-21 DIAGNOSIS — Z6791 Unspecified blood type, Rh negative: Secondary | ICD-10-CM

## 2020-12-21 DIAGNOSIS — O26893 Other specified pregnancy related conditions, third trimester: Secondary | ICD-10-CM | POA: Diagnosis present

## 2020-12-21 DIAGNOSIS — Z87891 Personal history of nicotine dependence: Secondary | ICD-10-CM

## 2020-12-21 DIAGNOSIS — O9852 Other viral diseases complicating childbirth: Secondary | ICD-10-CM | POA: Diagnosis present

## 2020-12-21 DIAGNOSIS — O99214 Obesity complicating childbirth: Secondary | ICD-10-CM | POA: Diagnosis present

## 2020-12-21 DIAGNOSIS — Z302 Encounter for sterilization: Secondary | ICD-10-CM

## 2020-12-21 DIAGNOSIS — Z98891 History of uterine scar from previous surgery: Secondary | ICD-10-CM

## 2020-12-21 HISTORY — PX: TUBAL LIGATION: SHX77

## 2020-12-21 LAB — COMPREHENSIVE METABOLIC PANEL
ALT: 22 U/L (ref 0–44)
AST: 26 U/L (ref 15–41)
Albumin: 3.1 g/dL — ABNORMAL LOW (ref 3.5–5.0)
Alkaline Phosphatase: 103 U/L (ref 38–126)
Anion gap: 10 (ref 5–15)
BUN: 9 mg/dL (ref 6–20)
CO2: 22 mmol/L (ref 22–32)
Calcium: 9.5 mg/dL (ref 8.9–10.3)
Chloride: 102 mmol/L (ref 98–111)
Creatinine, Ser: 0.57 mg/dL (ref 0.44–1.00)
GFR, Estimated: >60 mL/min (ref >60)
Glucose, Bld: 111 mg/dL — ABNORMAL HIGH (ref 70–99)
Potassium: 3.8 mmol/L (ref 3.5–5.1)
Sodium: 134 mmol/L — ABNORMAL LOW (ref 135–145)
Total Bilirubin: 0.6 mg/dL (ref 0.3–1.2)
Total Protein: 6.5 g/dL (ref 6.5–8.1)

## 2020-12-21 LAB — CBC
HCT: 37.4 % (ref 36.0–46.0)
Hemoglobin: 12.5 g/dL (ref 12.0–15.0)
MCH: 29.1 pg (ref 26.0–34.0)
MCHC: 33.4 g/dL (ref 30.0–36.0)
MCV: 87 fL (ref 80.0–100.0)
Platelets: 248 10*3/uL (ref 150–400)
RBC: 4.3 MIL/uL (ref 3.87–5.11)
RDW: 13.4 % (ref 11.5–15.5)
WBC: 13.8 10*3/uL — ABNORMAL HIGH (ref 4.0–10.5)
nRBC: 0 % (ref 0.0–0.2)

## 2020-12-21 LAB — RESP PANEL BY RT-PCR (FLU A&B, COVID) ARPGX2
Influenza A by PCR: NEGATIVE
Influenza B by PCR: NEGATIVE
SARS Coronavirus 2 by RT PCR: POSITIVE — AB

## 2020-12-21 LAB — RPR: RPR Ser Ql: NONREACTIVE

## 2020-12-21 SURGERY — Surgical Case
Anesthesia: Spinal | Laterality: Bilateral

## 2020-12-21 MED ORDER — ACETAMINOPHEN 500 MG PO TABS
1000.0000 mg | ORAL_TABLET | Freq: Four times a day (QID) | ORAL | Status: AC
  Administered 2020-12-21 – 2020-12-22 (×3): 1000 mg via ORAL
  Filled 2020-12-21 (×3): qty 2

## 2020-12-21 MED ORDER — OXYCODONE-ACETAMINOPHEN 5-325 MG PO TABS
1.0000 | ORAL_TABLET | ORAL | Status: DC | PRN
  Administered 2020-12-22 (×2): 1 via ORAL
  Filled 2020-12-21: qty 1
  Filled 2020-12-21: qty 2
  Filled 2020-12-21: qty 1

## 2020-12-21 MED ORDER — OXYTOCIN-SODIUM CHLORIDE 30-0.9 UT/500ML-% IV SOLN
2.5000 [IU]/h | INTRAVENOUS | Status: AC
  Administered 2020-12-21: 2.5 [IU]/h via INTRAVENOUS
  Filled 2020-12-21: qty 500

## 2020-12-21 MED ORDER — DIBUCAINE (PERIANAL) 1 % EX OINT: 1.0000 "application " | TOPICAL_OINTMENT | CUTANEOUS | Status: DC | PRN

## 2020-12-21 MED ORDER — CHLORHEXIDINE GLUCONATE 0.12 % MT SOLN
15.0000 mL | Freq: Once | OROMUCOSAL | Status: AC
  Administered 2020-12-21: 15 mL via OROMUCOSAL
  Filled 2020-12-21 (×2): qty 15

## 2020-12-21 MED ORDER — ACETAMINOPHEN 325 MG PO TABS
650.0000 mg | ORAL_TABLET | ORAL | Status: DC | PRN
  Administered 2020-12-22 – 2020-12-23 (×4): 650 mg via ORAL
  Filled 2020-12-21 (×6): qty 2

## 2020-12-21 MED ORDER — ONDANSETRON HCL 4 MG/2ML IJ SOLN: 4.0000 mg | Freq: Three times a day (TID) | INTRAMUSCULAR | Status: DC | PRN

## 2020-12-21 MED ORDER — TETANUS-DIPHTH-ACELL PERTUSSIS 5-2.5-18.5 LF-MCG/0.5 IM SUSY
0.5000 mL | PREFILLED_SYRINGE | Freq: Once | INTRAMUSCULAR | Status: DC
Start: 2020-12-22 — End: 2020-12-23

## 2020-12-21 MED ORDER — FENTANYL CITRATE (PF) 100 MCG/2ML IJ SOLN
25.0000 ug | INTRAMUSCULAR | Status: DC | PRN
  Administered 2020-12-21: 25 ug via INTRAVENOUS

## 2020-12-21 MED ORDER — NALBUPHINE HCL 10 MG/ML IJ SOLN: 5.0000 mg | Freq: Once | INTRAMUSCULAR | Status: DC | PRN

## 2020-12-21 MED ORDER — MEPERIDINE HCL 25 MG/ML IJ SOLN: 6.2500 mg | INTRAMUSCULAR | Status: DC | PRN

## 2020-12-21 MED ORDER — DEXMEDETOMIDINE (PRECEDEX) IN NS 20 MCG/5ML (4 MCG/ML) IV SYRINGE: PREFILLED_SYRINGE | INTRAVENOUS | Status: DC | PRN

## 2020-12-21 MED ORDER — SOD CITRATE-CITRIC ACID 500-334 MG/5ML PO SOLN
30.0000 mL | Freq: Once | ORAL | Status: AC
  Administered 2020-12-21: 30 mL via ORAL
  Filled 2020-12-21: qty 30

## 2020-12-21 MED ORDER — SODIUM CHLORIDE 0.9 % IV SOLN
INTRAVENOUS | Status: DC | PRN
  Administered 2020-12-21: 60 ug/min via INTRAVENOUS

## 2020-12-21 MED ORDER — PRENATAL MULTIVITAMIN CH
1.0000 | ORAL_TABLET | Freq: Every day | ORAL | Status: DC
  Administered 2020-12-22: 1 via ORAL
  Filled 2020-12-21 (×2): qty 1

## 2020-12-21 MED ORDER — STERILE WATER FOR IRRIGATION IR SOLN
Status: DC | PRN
  Administered 2020-12-21: 1000 mL

## 2020-12-21 MED ORDER — BUPIVACAINE IN DEXTROSE 0.75-8.25 % IT SOLN
INTRATHECAL | Status: DC | PRN
  Administered 2020-12-21: 1.8 mL via INTRATHECAL

## 2020-12-21 MED ORDER — SCOPOLAMINE 1 MG/3DAYS TD PT72
1.0000 | MEDICATED_PATCH | Freq: Once | TRANSDERMAL | Status: DC
  Administered 2020-12-21: 1.5 mg via TRANSDERMAL
  Filled 2020-12-21: qty 1

## 2020-12-21 MED ORDER — NALBUPHINE HCL 10 MG/ML IJ SOLN: 5.0000 mg | INTRAMUSCULAR | Status: DC | PRN

## 2020-12-21 MED ORDER — SODIUM CHLORIDE 0.9 % IV SOLN
2.0000 g | INTRAVENOUS | Status: AC
  Administered 2020-12-21: 2 g via INTRAVENOUS
  Filled 2020-12-21: qty 2

## 2020-12-21 MED ORDER — FENTANYL CITRATE (PF) 100 MCG/2ML IJ SOLN
INTRAMUSCULAR | Status: DC | PRN
  Administered 2020-12-21: 15 ug via INTRATHECAL

## 2020-12-21 MED ORDER — DIPHENHYDRAMINE HCL 50 MG/ML IJ SOLN
12.5000 mg | INTRAMUSCULAR | Status: DC | PRN
  Administered 2020-12-21: 12.5 mg via INTRAVENOUS
  Filled 2020-12-21: qty 1

## 2020-12-21 MED ORDER — ACETAMINOPHEN 10 MG/ML IV SOLN: 1000.0000 mg | Freq: Once | INTRAVENOUS | Status: DC | PRN

## 2020-12-21 MED ORDER — MENTHOL 3 MG MT LOZG: 1.0000 | LOZENGE | OROMUCOSAL | Status: DC | PRN

## 2020-12-21 MED ORDER — LEVOTHYROXINE SODIUM 50 MCG PO TABS
50.0000 ug | ORAL_TABLET | Freq: Every day | ORAL | Status: DC
  Administered 2020-12-22 – 2020-12-23 (×2): 50 ug via ORAL
  Filled 2020-12-21 (×2): qty 1

## 2020-12-21 MED ORDER — NALOXONE HCL 0.4 MG/ML IJ SOLN: 0.4000 mg | INTRAMUSCULAR | Status: DC | PRN

## 2020-12-21 MED ORDER — LACTATED RINGERS IV SOLN: INTRAVENOUS | Status: DC

## 2020-12-21 MED ORDER — ZOLPIDEM TARTRATE 5 MG PO TABS: 5.0000 mg | ORAL_TABLET | Freq: Every evening | ORAL | Status: DC | PRN

## 2020-12-21 MED ORDER — SIMETHICONE 80 MG PO CHEW
80.0000 mg | CHEWABLE_TABLET | Freq: Three times a day (TID) | ORAL | Status: DC
  Administered 2020-12-21 – 2020-12-23 (×5): 80 mg via ORAL
  Filled 2020-12-21 (×5): qty 1

## 2020-12-21 MED ORDER — ORAL CARE MOUTH RINSE: 15.0000 mL | Freq: Once | OROMUCOSAL | Status: AC

## 2020-12-21 MED ORDER — FENTANYL CITRATE (PF) 100 MCG/2ML IJ SOLN
INTRAMUSCULAR | Status: DC | PRN
  Administered 2020-12-21: 50 ug via INTRAVENOUS
  Administered 2020-12-21: 35 ug via INTRAVENOUS
  Administered 2020-12-21 (×2): 50 ug via INTRAVENOUS

## 2020-12-21 MED ORDER — FAMOTIDINE 20 MG PO TABS
40.0000 mg | ORAL_TABLET | Freq: Two times a day (BID) | ORAL | Status: DC
  Administered 2020-12-21 – 2020-12-23 (×4): 40 mg via ORAL
  Filled 2020-12-21 (×4): qty 2

## 2020-12-21 MED ORDER — MEASLES, MUMPS & RUBELLA VAC IJ SOLR: 0.5000 mL | Freq: Once | INTRAMUSCULAR | Status: DC

## 2020-12-21 MED ORDER — DEXAMETHASONE SODIUM PHOSPHATE 4 MG/ML IJ SOLN
INTRAMUSCULAR | Status: DC | PRN
  Administered 2020-12-21: 4 mg via INTRAVENOUS

## 2020-12-21 MED ORDER — MONTELUKAST SODIUM 10 MG PO TABS
10.0000 mg | ORAL_TABLET | Freq: Every day | ORAL | Status: DC
  Administered 2020-12-21 – 2020-12-22 (×2): 10 mg via ORAL
  Filled 2020-12-21 (×2): qty 1

## 2020-12-21 MED ORDER — MIDAZOLAM HCL 2 MG/2ML IJ SOLN
INTRAMUSCULAR | Status: DC | PRN
  Administered 2020-12-21 (×2): 1 mg via INTRAVENOUS

## 2020-12-21 MED ORDER — SIMETHICONE 80 MG PO CHEW
80.0000 mg | CHEWABLE_TABLET | ORAL | Status: DC | PRN
  Administered 2020-12-23: 80 mg via ORAL
  Filled 2020-12-21: qty 1

## 2020-12-21 MED ORDER — ONDANSETRON HCL 4 MG/2ML IJ SOLN
INTRAMUSCULAR | Status: DC | PRN
  Administered 2020-12-21: 4 mg via INTRAVENOUS

## 2020-12-21 MED ORDER — DEXMEDETOMIDINE (PRECEDEX) IN NS 20 MCG/5ML (4 MCG/ML) IV SYRINGE
PREFILLED_SYRINGE | INTRAVENOUS | Status: DC | PRN
  Administered 2020-12-21 (×2): 8 ug via INTRAVENOUS

## 2020-12-21 MED ORDER — MORPHINE SULFATE (PF) 0.5 MG/ML IJ SOLN
INTRAMUSCULAR | Status: DC | PRN
  Administered 2020-12-21: 150 ug via INTRATHECAL

## 2020-12-21 MED ORDER — PROMETHAZINE HCL 25 MG/ML IJ SOLN: 6.2500 mg | INTRAMUSCULAR | Status: DC | PRN

## 2020-12-21 MED ORDER — FENTANYL CITRATE (PF) 100 MCG/2ML IJ SOLN
100.0000 ug | Freq: Once | INTRAMUSCULAR | Status: AC
  Administered 2020-12-21: 100 ug via INTRAVENOUS
  Filled 2020-12-21: qty 2

## 2020-12-21 MED ORDER — COCONUT OIL OIL: 1.0000 "application " | TOPICAL_OIL | Status: DC | PRN

## 2020-12-21 MED ORDER — DROPERIDOL 2.5 MG/ML IJ SOLN: 0.6250 mg | Freq: Once | INTRAMUSCULAR | Status: DC | PRN

## 2020-12-21 MED ORDER — SODIUM CHLORIDE 0.9% FLUSH: 3.0000 mL | INTRAVENOUS | Status: DC | PRN

## 2020-12-21 MED ORDER — LACTATED RINGERS IV BOLUS
1000.0000 mL | Freq: Once | INTRAVENOUS | Status: AC
  Administered 2020-12-21: 1000 mL via INTRAVENOUS

## 2020-12-21 MED ORDER — ACETAMINOPHEN 10 MG/ML IV SOLN
INTRAVENOUS | Status: DC | PRN
  Administered 2020-12-21: 1000 mg via INTRAVENOUS

## 2020-12-21 MED ORDER — OLOPATADINE HCL 0.6 % NA SOLN: Freq: Two times a day (BID) | NASAL | Status: DC

## 2020-12-21 MED ORDER — DIPHENHYDRAMINE HCL 25 MG PO CAPS: 25.0000 mg | ORAL_CAPSULE | Freq: Four times a day (QID) | ORAL | Status: DC | PRN

## 2020-12-21 MED ORDER — OXYCODONE HCL 5 MG/5ML PO SOLN: 5.0000 mg | Freq: Once | ORAL | Status: DC | PRN

## 2020-12-21 MED ORDER — OXYCODONE HCL 5 MG PO TABS: 5.0000 mg | ORAL_TABLET | Freq: Once | ORAL | Status: DC | PRN

## 2020-12-21 MED ORDER — WITCH HAZEL-GLYCERIN EX PADS: 1.0000 "application " | MEDICATED_PAD | CUTANEOUS | Status: DC | PRN

## 2020-12-21 MED ORDER — CEFAZOLIN IN SODIUM CHLORIDE 3-0.9 GM/100ML-% IV SOLN
3.0000 g | INTRAVENOUS | Status: DC
  Filled 2020-12-21 (×2): qty 100

## 2020-12-21 MED ORDER — SENNOSIDES-DOCUSATE SODIUM 8.6-50 MG PO TABS
2.0000 | ORAL_TABLET | ORAL | Status: DC
  Administered 2020-12-22 – 2020-12-23 (×2): 2 via ORAL
  Filled 2020-12-21 (×2): qty 2

## 2020-12-21 MED ORDER — LABETALOL HCL 100 MG PO TABS
100.0000 mg | ORAL_TABLET | Freq: Two times a day (BID) | ORAL | Status: DC
  Administered 2020-12-21 – 2020-12-23 (×4): 100 mg via ORAL
  Filled 2020-12-21 (×4): qty 1

## 2020-12-21 MED ORDER — VENLAFAXINE HCL ER 37.5 MG PO CP24
37.5000 mg | ORAL_CAPSULE | Freq: Every morning | ORAL | Status: DC
  Administered 2020-12-22 – 2020-12-23 (×2): 37.5 mg via ORAL
  Filled 2020-12-21 (×4): qty 1

## 2020-12-21 MED ORDER — FENTANYL CITRATE (PF) 100 MCG/2ML IJ SOLN
INTRAMUSCULAR | Status: AC
  Filled 2020-12-21: qty 2

## 2020-12-21 MED ORDER — DIPHENHYDRAMINE HCL 25 MG PO CAPS: 25.0000 mg | ORAL_CAPSULE | ORAL | Status: DC | PRN

## 2020-12-21 MED ORDER — OXYTOCIN-SODIUM CHLORIDE 30-0.9 UT/500ML-% IV SOLN
INTRAVENOUS | Status: DC | PRN
  Administered 2020-12-21: 300 mL via INTRAVENOUS

## 2020-12-21 MED ORDER — NALOXONE HCL 4 MG/10ML IJ SOLN
1.0000 ug/kg/h | INTRAVENOUS | Status: DC | PRN
  Filled 2020-12-21: qty 5

## 2020-12-21 SURGICAL SUPPLY — 37 items
BENZOIN TINCTURE PRP APPL 2/3 (GAUZE/BANDAGES/DRESSINGS) IMPLANT
CLAMP CORD UMBIL (MISCELLANEOUS) IMPLANT
CLIP FILSHIE TUBAL LIGA STRL (Clip) ×4 IMPLANT
CLOTH BEACON ORANGE TIMEOUT ST (SAFETY) ×2 IMPLANT
DERMABOND ADVANCED (GAUZE/BANDAGES/DRESSINGS)
DERMABOND ADVANCED .7 DNX12 (GAUZE/BANDAGES/DRESSINGS) IMPLANT
DRESSING PREVENA PLUS CUSTOM (GAUZE/BANDAGES/DRESSINGS) ×1 IMPLANT
DRSG OPSITE POSTOP 4X10 (GAUZE/BANDAGES/DRESSINGS) ×2 IMPLANT
DRSG PREVENA PLUS CUSTOM (GAUZE/BANDAGES/DRESSINGS) ×2
ELECT REM PT RETURN 9FT ADLT (ELECTROSURGICAL) ×2
ELECTRODE REM PT RTRN 9FT ADLT (ELECTROSURGICAL) ×1 IMPLANT
EXTRACTOR VACUUM M CUP 4 TUBE (SUCTIONS) IMPLANT
GLOVE BIO SURGEON STRL SZ7.5 (GLOVE) ×2 IMPLANT
GLOVE BIOGEL PI IND STRL 7.0 (GLOVE) ×1 IMPLANT
GLOVE BIOGEL PI INDICATOR 7.0 (GLOVE) ×1
GOWN STRL REUS W/TWL LRG LVL3 (GOWN DISPOSABLE) ×4 IMPLANT
KIT ABG SYR 3ML LUER SLIP (SYRINGE) ×2 IMPLANT
NEEDLE HYPO 22GX1.5 SAFETY (NEEDLE) ×2 IMPLANT
NEEDLE HYPO 25X5/8 SAFETYGLIDE (NEEDLE) ×2 IMPLANT
NS IRRIG 1000ML POUR BTL (IV SOLUTION) ×2 IMPLANT
PACK C SECTION WH (CUSTOM PROCEDURE TRAY) ×2 IMPLANT
PAD ABD 7.5X8 STRL (GAUZE/BANDAGES/DRESSINGS) ×2 IMPLANT
PAD OB MATERNITY 4.3X12.25 (PERSONAL CARE ITEMS) ×2 IMPLANT
PENCIL SMOKE EVAC W/HOLSTER (ELECTROSURGICAL) ×2 IMPLANT
RETAINER VISCERAL (MISCELLANEOUS) ×2 IMPLANT
STRIP CLOSURE SKIN 1/2X4 (GAUZE/BANDAGES/DRESSINGS) IMPLANT
SUT MNCRL 0 VIOLET CTX 36 (SUTURE) ×5 IMPLANT
SUT MONOCRYL 0 CTX 36 (SUTURE) ×5
SUT PDS AB 0 CTX 60 (SUTURE) ×6 IMPLANT
SUT PLAIN 0 NONE (SUTURE) IMPLANT
SUT PLAIN 2 0 (SUTURE)
SUT PLAIN 2 0 XLH (SUTURE) ×2 IMPLANT
SUT PLAIN ABS 2-0 CT1 27XMFL (SUTURE) IMPLANT
SUT VIC AB 4-0 KS 27 (SUTURE) ×2 IMPLANT
TOWEL OR 17X24 6PK STRL BLUE (TOWEL DISPOSABLE) ×2 IMPLANT
TRAY FOLEY W/BAG SLVR 14FR LF (SET/KITS/TRAYS/PACK) ×2 IMPLANT
WATER STERILE IRR 1000ML POUR (IV SOLUTION) ×2 IMPLANT

## 2020-12-21 NOTE — Progress Notes (Signed)
Hansel Feinstein CNM aware of pt's admission and status. Will reck cervix in an hour. Aware of hx of classical c/s and for repeat on Friday. Also aware of covid + status as of last night. Only symptom is runny nose.

## 2020-12-21 NOTE — Op Note (Signed)
Cesarean Section Procedure Note  Pre-operative Diagnosis: IUP at 37 weeks, labor, Previous classical c/s not to labor, desires sterility, Covid positive  Post-operative Diagnosis: same  Surgeon: [name]   Assistants: tracey, RNFA  Anesthesia: spinal  Procedure:  Low Segment Transverse cesarean section and bilateral tubal ligation with filshie clips  Procedure Details  The patient was seen in the Holding Room. The risks, benefits, complications, treatment options, and expected outcomes were discussed with the patient.  The patient concurred with the proposed plan, giving informed consent.  The site of surgery properly noted/marked.. A Time Out was held and the above information confirmed.  After induction of anesthesia, the patient was draped and prepped in the usual sterile manner. A Pfannenstiel incision was made and carried down through the subcutaneous tissue to the fascia. Fascial incision was made and extended transversely. The fascia was separated from the underlying rectus tissue superiorly and inferiorly. The peritoneum was identified and entered. Peritoneal incision was extended longitudinally. Alexis retracted placed.  The utero-vesical peritoneal reflection was incised transversely and the bladder flap was bluntly freed from the lower uterine segment. A low transverse uterine incision was made. Delivered from vertex presentation was a baby with Apgar scores of 9 at one minute and 9 at five minutes. After the umbilical cord was clamped and cut cord blood was obtained for evaluation. The placenta was removed intact and appeared normal. The uterine outline, tubes and ovaries appeared normal. The uterine incision was closed with running locked sutures of 0 monocryl and imbricated with 0 monocryl. Hemostasis was observed. Lavage was carried out until clear. The fallopian tubes were identified by their fimbriated ends and filshie clips were placed across the tubes at the midportion of each  tube.  Good placement was noted bilaterally.  The peritoneum was then closed with 0 monocryl and rectus muscles plicated in the midline.  After hemostasis was assured, the fascia was then reapproximated with running sutures of 2 double looped 0 PDS sutures. Irrigation was applied and after adequate hemostasis was assured, the skin was reapproximated with subcutaneous sutures using 4-0 monocryl.  Instrument, sponge, and needle counts were correct prior the abdominal closure and at the conclusion of the case. The patient received 2 grams cefotetan preoperatively.  Findings: Viable female  Estimated Blood Loss:  1232cc         Specimens: Placenta was sent to labor and delivery         Complications:  None

## 2020-12-21 NOTE — MAU Note (Addendum)
Ctxs started last night about 2230 and have gotten stronger throughout the night. Having a lot of pelvic pressure and lower back pain which sometimes radiates to abdomen. Tested positive for covid last night. Daughter positive last wk and husband tested positive last Weds. Both tested negative on Monday. Pt's only symptom is a runny nose

## 2020-12-21 NOTE — Transfer of Care (Signed)
Immediate Anesthesia Transfer of Care Note  Patient: [name] [name]  Procedure(s) Performed: REPEAT CESAREAN SECTION WITH BILATERAL TUBAL LIGATION EDC: 01-11-21 ALLERG: SULFA, IBUPROFEN  PREVIOUS X 1 (PRIOR CLASSICAL) (Bilateral)  Patient Location: PACU  Anesthesia Type:Spinal  Level of Consciousness: awake, alert  and oriented  Airway & Oxygen Therapy: Patient Spontanous Breathing  Post-op Assessment: Report given to RN and Post -op Vital signs reviewed and stable  Post vital signs: Reviewed and stable HR 118 and SaO2 97%  Last Vitals:  Vitals Value Taken Time  BP 124/85 12/21/20 1237  Temp    Pulse    Resp 21 12/21/20 1240  SpO2    Vitals shown include unvalidated device data.  Last Pain:  Vitals:   12/21/20 0844  TempSrc:   PainSc: 3       Patients Stated Pain Goal: 0 ([id number])  Complications: No notable events documented.

## 2020-12-21 NOTE — Anesthesia Postprocedure Evaluation (Signed)
Anesthesia Post Note  Patient: Aliea Brisendine  Procedure(s) Performed: REPEAT CESAREAN SECTION WITH BILATERAL TUBAL LIGATION EDC: 01-11-21 ALLERG: SULFA, IBUPROFEN  PREVIOUS X 1 (PRIOR CLASSICAL) (Bilateral)     Patient location during evaluation: Mother Baby Anesthesia Type: Spinal Level of consciousness: oriented and awake and alert Pain management: pain level controlled Vital Signs Assessment: post-procedure vital signs reviewed and stable Respiratory status: spontaneous breathing and respiratory function stable Cardiovascular status: blood pressure returned to baseline and stable Postop Assessment: no headache, no backache, no apparent nausea or vomiting, able to ambulate and spinal receding Anesthetic complications: no   No notable events documented.  Last Vitals:  Vitals:   12/21/20 1345 12/21/20 1408  BP: 133/75 138/75  Pulse: (!) 116 (!) 116  Resp: (!) 22 20  Temp:  36.8 C  SpO2: 97% 96%    Last Pain:  Vitals:   12/21/20 1408  TempSrc: Oral  PainSc: 2    Pain Goal: Patients Stated Pain Goal: 0 (12/21/20 0620)                 Merlinda Frederick

## 2020-12-21 NOTE — Anesthesia Preprocedure Evaluation (Addendum)
Anesthesia Evaluation  Patient identified by MRN, date of birth, ID band  Reviewed: Allergy & Precautions, NPO status , Patient's Chart, lab work & pertinent test results  Airway Mallampati: III  TM Distance: >3 FB Neck ROM: Full    Dental no notable dental hx.    Pulmonary shortness of breath and with exertion, former smoker,  + Covid. + rhinorrhea   Pulmonary exam normal breath sounds clear to auscultation       Cardiovascular Exercise Tolerance: Poor hypertension, Normal cardiovascular exam Rhythm:Regular Rate:Normal     Neuro/Psych PSYCHIATRIC DISORDERS Anxiety  Neuromuscular disease    GI/Hepatic negative GI ROS, Neg liver ROS,   Endo/Other  Hypothyroidism Morbid obesity  Renal/GU   negative genitourinary   Musculoskeletal negative musculoskeletal ROS (+)   Abdominal   Peds  Hematology negative hematology ROS (+)   Anesthesia Other Findings   Reproductive/Obstetrics negative OB ROS                           Anesthesia Physical Anesthesia Plan  ASA: 4  Anesthesia Plan: Spinal   Post-op Pain Management:    Induction:   PONV Risk Score and Plan: 2 and Treatment may vary due to age or medical condition  Airway Management Planned: Natural Airway  Additional Equipment: None  Intra-op Plan:   Post-operative Plan:   Informed Consent: I have reviewed the patients History and Physical, chart, labs and discussed the procedure including the risks, benefits and alternatives for the proposed anesthesia with the patient or authorized representative who has indicated his/her understanding and acceptance.     Dental advisory given  Plan Discussed with: CRNA and Anesthesiologist  Anesthesia Plan Comments: (+ COVID precautions. Spinal. GETA backup. Norton Blizzard, MD  )        Anesthesia Quick Evaluation

## 2020-12-21 NOTE — Anesthesia Procedure Notes (Signed)
Spinal  Patient location during procedure: OR Start time: 12/21/2020 11:12 AM End time: 12/21/2020 11:18 AM Reason for block: surgical anesthesia Staffing Performed: anesthesiologist  Anesthesiologist: Merlinda Frederick, MD Preanesthetic Checklist Completed: patient identified, IV checked, risks and benefits discussed, surgical consent, monitors and equipment checked, pre-op evaluation and timeout performed Spinal Block Patient position: sitting Prep: DuraPrep Patient monitoring: cardiac monitor, continuous pulse ox and blood pressure Approach: midline Location: L3-4 Injection technique: single-shot Needle Needle type: Pencan  Needle gauge: 24 G Needle length: 9 cm Assessment Events: CSF return Additional Notes Functioning IV was confirmed and monitors were applied. Sterile prep and drape, including hand hygiene and sterile gloves were used. The patient was positioned and the spine was prepped. The skin was anesthetized with lidocaine.  Free flow of clear CSF was obtained prior to injecting local anesthetic into the CSF.  The spinal needle aspirated freely following injection.  The needle was carefully withdrawn.  The patient tolerated the procedure well.

## 2020-12-21 NOTE — Social Work (Addendum)
MOB was referred for history of anxiety.   * Referral screened out by Clinical Social Worker because none of the following criteria appear to apply:  ~ History of anxiety/depression during this pregnancy, or of post-partum depression following prior delivery. ~ Diagnosis of anxiety and/or depression within last 3 years. MOB diagnosed in 2017 or prior. OR * MOB's symptoms currently being treated with medication and/or therapy. CSW reviewed chart and  notes MOB currently prescribed Venlafaxine to treat. MOB also sees a therapist twice a month.  Please contact the Clinical Social Worker if needs arise, by [facility] request, or if MOB scores greater than 9/yes to question 10 on Edinburgh Postpartum Depression Screen.  Darra Lis, [place] [employer]  [phone]

## 2020-12-21 NOTE — H&P (Signed)
[name] [name] is a 35 y.o. female presenting for labor symptoms with regular strong ctxs requiring pain medicine.  Recent covid positive after husband and daugher had it last week and they are currently covid negative.  She's asymptomatic except runny nose. Previous high transverse c/s for previous delivery at [place] due to PPROM and cord prolapse.  Is NOT to labor and is scheduled for repeat c/s in 2 days.  She also desires sterilization.  GBS + In MAU, given IVF bolus and fentanyl for pain.  Still with regular strong ctxs and fetal tachycardia 170s.  Pt has chronic HTN on labetalol.  No PIH sxs . OB History     Gravida  2   Para  1   Term      Preterm  1   AB      Living  1      SAB      IAB      Ectopic      Multiple  0   Live Births  1          Past Medical History:  Diagnosis Date   Anxiety    Carpal tunnel syndrome    right hand   Dyspnea    uses albuterol inhaler with patient gets sick   Environmental and seasonal allergies    History of panic attacks    Hypertension 2019   Hypothyroid    Obese    Seasonal allergies    Shingles    Past Surgical History:  Procedure Laterality Date   CESAREAN SECTION N/A 06/23/2015   Procedure: CESAREAN SECTION;  Surgeon: [name] Pearson, MD;  Location: [facility];  Service: Obstetrics;  Laterality: N/A;   CHOLECYSTECTOMY     HYSTEROSCOPY WITH D & C N/A 12/03/2016   Procedure: DILATATION AND CURETTAGE /HYSTEROSCOPY;  Surgeon: [name] Farrier, MD;  Location: [facility];  Service: Gynecology;  Laterality: N/A;   INTRAUTERINE DEVICE (IUD) INSERTION N/A 12/03/2016   Procedure: INTRAUTERINE DEVICE (IUD) INSERTION;  Surgeon: [name] Farrier, MD;  Location: [facility];  Service: Gynecology;  Laterality: N/A;  MD BRINGING IUD FROM OFFICE Mirena   Lot # [id number]  Exp 05/2019   LAPAROSCOPIC BILATERAL SALPINGO OOPHERECTOMY Right 01/19/2020   Procedure: LAPAROSCOPIC RIGHT OOPHORECTOMY  ;  Surgeon: [name] Farrier, MD;  Location: [facility];   Service: Gynecology;  Laterality: Right;   LAPAROSCOPIC LYSIS OF ADHESIONS  01/19/2020   Procedure: LYSIS OF ADHESIONS;  Surgeon: [name] Farrier, MD;  Location: [facility];  Service: Gynecology;;   MINOR CARPAL TUNNEL Left    WISDOM TOOTH EXTRACTION     Family History: family history includes Anxiety disorder in her father; Diabetes in her father and paternal aunt; Hypertension in her father and mother; Ovarian cancer in her paternal aunt. Social History:  reports that she quit smoking about 9 years ago. Her smoking use included cigarettes. She has a 5.00 pack-year smoking history. She has quit using smokeless tobacco.  Her smokeless tobacco use included chew. She reports that she does not drink alcohol and does not use drugs.     Maternal Diabetes: No Genetic Screening: Normal Maternal Ultrasounds/Referrals: Fetal renal pyelectasis Fetal Ultrasounds or other Referrals:  None Maternal Substance Abuse:  No Significant Maternal Medications:  Meds include: Progesterone Other: ;labetalol Significant Maternal Lab Results:  Group B Strep positive, clinda resistant Other Comments:  None  Review of Systems History Dilation: Fingertip Effacement (%): 50 Station: Ballotable Exam by:: [employer] RN Blood pressure 123/64, pulse (!) 122, temperature  99.4 F (37.4 C), temperature source Oral, resp. rate 20, height '5\' 6"'$  (1.676 m), weight (!) 153.8 kg, last menstrual period 01/04/2020, SpO2 99 %, unknown if currently breastfeeding. Exam Physical Exam  Vitals and nursing note reviewed. Exam conducted with a chaperone present.  Constitutional:      Appearance: Normal appearance.  HENT:     Head: Normocephalic.  Eyes:     Pupils: Pupils are equal, round, and reactive to light.  Cardiovascular:     Rate and Rhythm: Normal rate and regular rhythm.     Pulses: Normal pulses.  Abdominal:     General: Abdomen is Gravid, nontender Neurological:     Mental Status: She is alert.  Cx  0.5/ballotable Prenatal labs: ABO, Rh: --/--/PENDING (08/17 [id number]) Antibody: PENDING (08/17 0734) Rubella: Immune (01/25 0000) RPR: Nonreactive (01/25 0000)  HBsAg: Negative (01/25 0000)  HIV: Non-reactive (01/25 0000)  GBS:     Assessment/Plan: IUP at 37 weeks Previous c/s for repeat Risks and benefits of C/S were discussed.  All questions were answered and informed consent was obtained.  Plan to proceed with low segment transverse Cesarean Section.  Previous high transverse c/s so not to labor Labor sxs, and fetal tachycardia not responsive to IVF.  Plan delivery Multiparity and desires sterility.  Discussed risks and benefits of sterilization including but not limited to risk of tubal failure quoted as 09/998.  She gives her informed consent.  Covid positive asymptomatic except runny nose.  Chronic HTN on labetalol and no PIH sxs.  Normal labs RH negative   [name] 12/21/2020, 9:54 AM

## 2020-12-22 LAB — CBC
HCT: 29.7 % — ABNORMAL LOW (ref 36.0–46.0)
Hemoglobin: 10 g/dL — ABNORMAL LOW (ref 12.0–15.0)
MCH: 29.4 pg (ref 26.0–34.0)
MCHC: 33.7 g/dL (ref 30.0–36.0)
MCV: 87.4 fL (ref 80.0–100.0)
Platelets: 216 10*3/uL (ref 150–400)
RBC: 3.4 MIL/uL — ABNORMAL LOW (ref 3.87–5.11)
RDW: 13.5 % (ref 11.5–15.5)
WBC: 11.3 10*3/uL — ABNORMAL HIGH (ref 4.0–10.5)
nRBC: 0 % (ref 0.0–0.2)

## 2020-12-22 MED ORDER — OXYCODONE HCL 5 MG PO TABS
5.0000 mg | ORAL_TABLET | ORAL | Status: DC | PRN
  Administered 2020-12-23 (×3): 5 mg via ORAL
  Filled 2020-12-22 (×2): qty 1

## 2020-12-22 MED ORDER — OXYCODONE HCL 5 MG PO TABS
10.0000 mg | ORAL_TABLET | ORAL | Status: DC | PRN
  Administered 2020-12-22 (×2): 10 mg via ORAL
  Filled 2020-12-22 (×4): qty 2

## 2020-12-22 MED ORDER — RHO D IMMUNE GLOBULIN 1500 UNIT/2ML IJ SOSY
300.0000 ug | PREFILLED_SYRINGE | Freq: Once | INTRAMUSCULAR | Status: AC
  Administered 2020-12-22: 300 ug via INTRAVENOUS
  Filled 2020-12-22: qty 2

## 2020-12-22 MED ORDER — LIDOCAINE 5 % EX PTCH
1.0000 | MEDICATED_PATCH | CUTANEOUS | Status: DC
  Administered 2020-12-22: 1 via TRANSDERMAL
  Filled 2020-12-22 (×2): qty 1

## 2020-12-22 NOTE — Progress Notes (Signed)
Pt reported R hip/leg nerve pain that is radiating from hip to knee. Percocet 5 mg given for pain of 8/10 per pt request; heat applied to hip. 45 minutes later, pain unresolved and pt asked for the second pill previously offered. 10 mg total given aby this RN and pain decreased to 3/10, with pt able to get some needed rest.    Rocky Crafts, RN 12/22/20

## 2020-12-22 NOTE — Progress Notes (Signed)
Subjective: Postpartum Day 1: Cesarean Delivery Patient reports tolerating PO and no problems voiding.  Patient reports right femoral nerve pain (no weakness) which has affected her third trimester of pregnancy.  This has worsened since delivery and is requesting medication to help. Patient is COVID positive and reports only mild congestion.  Objective: Vital signs in last 24 hours: Temp:  [97.9 F (36.6 C)-99.5 F (37.5 C)] 98 F (36.7 C) (08/18 0635) Pulse Rate:  [115-139] 119 (08/18 0635) Resp:  [19-29] 21 (08/18 0635) BP: (123-158)/(49-126) 131/81 (08/18 0635) SpO2:  [96 %-100 %] 100 % (08/18 [id number])  Physical Exam:  General: alert, cooperative, and appears stated age Lochia: appropriate Uterine Fundus: firm Incision: wound vac in place DVT Evaluation: No evidence of DVT seen on physical exam. Negative Homan's sign. No cords or calf tenderness.  Recent Labs    12/21/20 0734 12/22/20 0459  HGB 12.5 10.0*  HCT 37.4 29.7*    Assessment/Plan: Status post Cesarean section. Doing well postoperatively.  Continue current care. Right femoral nerve pain/anterior thigh pain-lidoderm patch ordered COVID pos-mild symptoms.  Will continue to monitor.  Linda Hedges 12/22/2020, 9:00 AM

## 2020-12-22 NOTE — Lactation Note (Signed)
This note was copied from a baby's chart. Lactation Consultation Note Mom has used DEBP several times. Mom wanted to pump to give her colostrum as supplement to baby. Mom has pumped 10 ml and 11 ml. Mom exclusively pumped for her 1st child d/t premie in NICU.  Answered questions mom had. Mom is doing well. Mom is very compressible. Praised mom for her hard work pumping and BF. Support groups and [facility] services encouraged. Lactation brochure given.   Patient Name: Girl Duska Brackenridge [id number] Date: 12/22/2020 Reason for consult: Initial assessment;Early term 37-38.6wks;Maternal endocrine disorder Age:4 hours  Maternal Data Has patient been taught Hand Expression?: Yes Does the patient have breastfeeding experience prior to this delivery?: Yes  Feeding    LATCH Score       Type of Nipple: Flat (semi flat very compressible)  Comfort (Breast/Nipple): Filling, red/small blisters or bruises, mild/mod discomfort (small stripe to Rt. nipple)         Lactation Tools Discussed/Used Tools: Pump Breast pump type: Double-Electric Breast Pump Reason for Pumping: mom wanted to pump and supplement w/her milk Pumping frequency: q3 hr Pumped volume: 11 mL  Interventions Interventions: Breast feeding basics reviewed;Pace feeding;Skin to skin;Expressed milk;Breast massage;Hand express;DEBP;Breast compression  Discharge    Consult Status Consult Status: Follow-up Date: 12/22/20 Follow-up type: In-patient    Theodoro Kalata 12/22/2020, 12:37 AM

## 2020-12-23 ENCOUNTER — Inpatient Hospital Stay (HOSPITAL_COMMUNITY)
Admission: RE | Admit: 2020-12-23 | Payer: BC Managed Care – PPO | Source: Home / Self Care | Admitting: Obstetrics and Gynecology

## 2020-12-23 LAB — RH IG WORKUP (INCLUDES ABO/RH)
Fetal Screen: NEGATIVE
Gestational Age(Wks): 37
Unit division: 0

## 2020-12-23 LAB — TYPE AND SCREEN
ABO/RH(D): O NEG
Antibody Screen: POSITIVE
Unit division: 0
Unit division: 0

## 2020-12-23 LAB — BPAM RBC
Blood Product Expiration Date: 202208242359
Blood Product Expiration Date: 202209012359
Unit Type and Rh: 9500
Unit Type and Rh: 9500

## 2020-12-23 MED ORDER — OXYCODONE HCL 5 MG PO TABS: 5.0000 mg | ORAL_TABLET | ORAL | 0 refills | Status: DC | PRN

## 2020-12-23 MED ORDER — ACETAMINOPHEN 325 MG PO TABS: 650.0000 mg | ORAL_TABLET | ORAL | 0 refills | Status: AC | PRN

## 2020-12-23 NOTE — Discharge Summary (Signed)
Obstetric Discharge Summary  [name] [name] is a 35 y.o. female that presented on 12/21/2020 for contractions. She was noted to be COVID positive with mild symptoms. She has a history of high transverse CS from prior pregnancy at 30 weeks due to PPROM and cord prolapse.  She was admitted to labor and delivery for repeat C section. She delivered a viable female infant on 12/21/2020.  Her postpartum course was uncomplicated and on PPD#2, she reported well controlled pain, spontaneous voiding, ambulating without difficulty, and tolerating PO.  She was stable for discharge home on 12/23/20 with plans for in-office follow up. She will follow up for a 1 week blood pressure check for chronic hypertension on labetalol, and for wound vac exam.  Hemoglobin  Date Value Ref Range Status  12/22/2020 10.0 (L) 12.0 - 15.0 g/dL Final   HCT  Date Value Ref Range Status  12/22/2020 29.7 (L) 36.0 - 46.0 % Final    Physical Exam:  General: alert and no distress Lochia: appropriate Uterine Fundus: firm Incision: wound vac in place with good seal DVT Evaluation: No evidence of DVT seen on physical exam.  Discharge Diagnoses: Term Pregnancy-delivered  Discharge Information: Date: 12/23/2020 Activity: Pelvic rest, as tolerated Diet: routine Medications: Tylenol, oxycodone Condition: stable Instructions: Refer to practice specific booklet.  Discussed prior to discharge.  Discharge to: [facility] Follow up.   Why: Please follow up 1 week following delivery for blood pressure check and incision check. Contact information: [address] [place] [address] [phone]                 Newborn Data: Live born female  Birth Weight: 7 lb 14.3 oz (3580 g) APGAR: 93, 84  Newborn Delivery   Birth date/time: 12/21/2020 11:39:00 Delivery type: C-Section, Low Transverse Trial of labor: No C-section categorization: Repeat       Home with mother.  Carlyon Shadow 12/23/2020, 11:11 PM

## 2020-12-23 NOTE — Progress Notes (Signed)
Postpartum Progress Note  Postpartum Day 2 s/p repeat Cesarean section.  Subjective:  Femoral nerve pain improved since yesterday.  Edema decreasing. Patient reports no overnight events.  She reports well controlled pain, ambulating without difficulty, voiding spontaneously, tolerating PO.  She reports Positive flatus, Negative BM.  Vaginal bleeding is minimal.  Objective: Blood pressure 122/81, pulse 83, temperature 97.9 F (36.6 C), temperature source Axillary, resp. rate 20, height '5\' 6"'$  (1.676 m), weight (!) 153.8 kg, last menstrual period 01/04/2020, SpO2 100 %, unknown if currently breastfeeding.  Physical Exam:  General: alert and no distress Lochia: appropriate Uterine Fundus: firm Incision:  negative pressure dressing in place with good seal DVT Evaluation: No evidence of DVT seen on physical exam.  Recent Labs    12/21/20 0734 12/22/20 0459  HGB 12.5 10.0*  HCT 37.4 29.7*    Assessment/Plan: Postpartum Day 2, s/p C-section cHTN affecting pregnancy - well controlled, rare mild range BP.  Continue labetalol 100 mg BID.  Lactation following COVID positive, minimal symptoms.  Baby girl doing well.  Doing well, continue routine postpartum care. Anticipate discharge today, patient strongly desires discharge Follow up in office for 1 week BP check and wound vac check   LOS: 2 days   Carlyon Shadow 12/23/2020, 7:29 AM

## 2020-12-23 NOTE — Progress Notes (Signed)
Pt mentioned that service response/dietary has continued to give her a hard time with ordering the support person tray. COVID isolation order in place and cafeteria aware that pt is COVID+ and qualifies for support person meal, but unable to fulfill order. MOB ordered outside of the hospital and this RN delivered food to room and apologized for inconvenience. TEFL teacher notified. Service response called to rectify issue and is unsure of how to change anything. Pt instructed to attempt again with breakfast order for herself and her partner and notify RN if unable.  Rocky Crafts, RN 12/23/20

## 2021-01-03 ENCOUNTER — Telehealth (HOSPITAL_COMMUNITY): Payer: Self-pay | Admitting: *Deleted

## 2021-01-03 NOTE — Telephone Encounter (Signed)
No answer or voicemail, so no message left.  Odis Hollingshead, RN 01-03-2021 at 9:38am

## 2021-04-04 ENCOUNTER — Other Ambulatory Visit: Payer: Self-pay | Admitting: Surgery

## 2021-04-18 NOTE — Progress Notes (Signed)
Surgical Instructions    Your procedure is scheduled on Monday December 19th.  Report to [facility] Main Entrance "A" at 11:30 A.M., then check in with the Admitting office.  Call this number if you have problems the morning of surgery:  [phone]   If you have any questions prior to your surgery date call [phone]: Open Monday-Friday 8am-4pm    Remember:  Do not eat after midnight the night before your surgery  You may drink clear liquids until 10:30am the morning of your surgery.   Clear liquids allowed are: Water, Non-Citrus Juices (without pulp), Carbonated Beverages, Clear Tea, Black Coffee ONLY (NO MILK, CREAM OR POWDERED CREAMER of any kind), and Gatorade  Please complete your PRE-SURGERY ENSURE that was provided to you by ..10:30am. the morning of surgery.  Please, if able, drink it in one setting. DO NOT SIP.     Take these medicines the morning of surgery with A SIP OF WATER cetirizine (ZYRTEC) 10 MG tablet famotidine (PEPCID) 20 MG tablet fluticasone (FLONASE) 50 MCG/ACT nasal spray labetalol (NORMODYNE) 100 MG tablet levothyroxine (SYNTHROID) 50 MCG tablet venlafaxine XR (EFFEXOR-XR) 37.5 MG 24 hr capsule   IF NEEDED olopatadine (PATANOL) 0.1 % ophthalmic solution  As of today, STOP taking any Aspirin (unless otherwise instructed by your surgeon) Aleve, Naproxen, Ibuprofen, Motrin, Advil, Goody's, BC's, all herbal medications, fish oil, and all vitamins.     After your COVID test   You are not required to quarantine however you are required to wear a well-fitting mask when you are out and around people not in your household.  If your mask becomes wet or soiled, replace with a new one.  Wash your hands often with soap and water for 20 seconds or clean your hands with an alcohol-based hand sanitizer that contains at least 60% alcohol.  Do not share personal items.  Notify your provider: if you are in close contact with someone who has COVID  or if you  develop a fever of 100.4 or greater, sneezing, cough, sore throat, shortness of breath or body aches.             Do not wear jewelry or makeup Do not wear lotions, powders, perfumes, or deodorant. Do not shave 48 hours prior to surgery.   Do not bring valuables to the hospital. DO Not wear nail polish, gel polish, artificial nails, or any other type of covering on natural nails including finger and toenails. If patients have artificial nails, gel coating, etc. that need to be removed by a nail salon, please have this removed prior to surgery or surgery may need to be canceled/delayed if the surgeon/ anesthesia feels like the patient is unable to be adequately monitored.             [facility] is not responsible for any belongings or valuables.  Do NOT Smoke (Tobacco/Vaping)  24 hours prior to your procedure  If you use a CPAP at night, you may bring your mask for your overnight stay.   Contacts, glasses, hearing aids, dentures or partials may not be worn into surgery, please bring cases for these belongings   For patients admitted to the hospital, discharge time will be determined by your treatment team.   Patients discharged the day of surgery will not be allowed to drive home, and someone needs to stay with them for 24 hours.  NO VISITORS WILL BE ALLOWED IN PRE-OP WHERE PATIENTS ARE PREPPED FOR SURGERY.  ONLY 1 SUPPORT PERSON MAY  BE PRESENT IN THE WAITING ROOM WHILE YOU ARE IN SURGERY.  IF YOU ARE TO BE ADMITTED, ONCE YOU ARE IN YOUR ROOM YOU WILL BE ALLOWED TWO (2) VISITORS. 1 (ONE) VISITOR MAY STAY OVERNIGHT BUT MUST ARRIVE TO THE ROOM BY 8pm.  Minor children may have two parents present. Special consideration for safety and communication needs will be reviewed on a case by case basis.  Special instructions:    Oral Hygiene is also important to reduce your risk of infection.  Remember - BRUSH YOUR TEETH THE MORNING OF SURGERY WITH YOUR REGULAR TOOTHPASTE   [facility]- Preparing  For Surgery  Before surgery, you can play an important role. Because skin is not sterile, your skin needs to be as free of germs as possible. You can reduce the number of germs on your skin by washing with CHG (chlorahexidine gluconate) Soap before surgery.  CHG is an antiseptic cleaner which kills germs and bonds with the skin to continue killing germs even after washing.     Please do not use if you have an allergy to CHG or antibacterial soaps. If your skin becomes reddened/irritated stop using the CHG.  Do not shave (including legs and underarms) for at least 48 hours prior to first CHG shower. It is OK to shave your face.  Please follow these instructions carefully.     Shower the NIGHT BEFORE SURGERY and the MORNING OF SURGERY with CHG Soap.   If you chose to wash your hair, wash your hair first as usual with your normal shampoo. After you shampoo, rinse your hair and body thoroughly to remove the shampoo.  Then [employer] and genitals (private parts) with your normal soap and rinse thoroughly to remove soap.  After that Use CHG Soap as you would any other liquid soap. You can apply CHG directly to the skin and wash gently with a scrungie or a clean washcloth.   Apply the CHG Soap to your body ONLY FROM THE NECK DOWN.  Do not use on open wounds or open sores. Avoid contact with your eyes, ears, mouth and genitals (private parts). Wash Face and genitals (private parts)  with your normal soap.   Wash thoroughly, paying special attention to the area where your surgery will be performed.  Thoroughly rinse your body with warm water from the neck down.  DO NOT shower/wash with your normal soap after using and rinsing off the CHG Soap.  Pat yourself dry with a CLEAN TOWEL.  Wear CLEAN PAJAMAS to bed the night before surgery  Place CLEAN SHEETS on your bed the night before your surgery  DO NOT SLEEP WITH PETS.   Day of Surgery:  Take a shower with CHG soap. Wear Clean/Comfortable  clothing the morning of surgery Do not apply any deodorants/lotions.   Remember to brush your teeth WITH YOUR REGULAR TOOTHPASTE.   Please read over the following fact sheets that you were given.

## 2021-04-19 ENCOUNTER — Encounter (HOSPITAL_COMMUNITY)
Admission: RE | Admit: 2021-04-19 | Discharge: 2021-04-19 | Disposition: A | Discharge status: Home / Self Care | Payer: BC Managed Care – PPO | Source: Ambulatory Visit | Attending: Surgery | Admitting: Surgery

## 2021-04-19 ENCOUNTER — Other Ambulatory Visit: Payer: Self-pay

## 2021-04-19 ENCOUNTER — Encounter (HOSPITAL_COMMUNITY): Payer: Self-pay

## 2021-04-19 VITALS — BP 145/99 | HR 91 | Temp 98.0°F | Resp 19 | Ht 66.0 in | Wt 327.9 lb

## 2021-04-19 DIAGNOSIS — I10 Essential (primary) hypertension: Secondary | ICD-10-CM | POA: Diagnosis not present

## 2021-04-19 DIAGNOSIS — Z01818 Encounter for other preprocedural examination: Secondary | ICD-10-CM | POA: Insufficient documentation

## 2021-04-19 HISTORY — DX: Other complications of anesthesia, initial encounter: T88.59XA

## 2021-04-19 HISTORY — DX: Nausea with vomiting, unspecified: R11.2

## 2021-04-19 HISTORY — DX: Other specified postprocedural states: Z98.890

## 2021-04-19 LAB — CBC
HCT: 41.4 % (ref 36.0–46.0)
Hemoglobin: 13.7 g/dL (ref 12.0–15.0)
MCH: 27.5 pg (ref 26.0–34.0)
MCHC: 33.1 g/dL (ref 30.0–36.0)
MCV: 83 fL (ref 80.0–100.0)
Platelets: 368 10*3/uL (ref 150–400)
RBC: 4.99 MIL/uL (ref 3.87–5.11)
RDW: 14.1 % (ref 11.5–15.5)
WBC: 12.9 10*3/uL — ABNORMAL HIGH (ref 4.0–10.5)
nRBC: 0 % (ref 0.0–0.2)

## 2021-04-19 LAB — BASIC METABOLIC PANEL
Anion gap: 6 (ref 5–15)
BUN: 18 mg/dL (ref 6–20)
CO2: 29 mmol/L (ref 22–32)
Calcium: 9.4 mg/dL (ref 8.9–10.3)
Chloride: 103 mmol/L (ref 98–111)
Creatinine, Ser: 0.71 mg/dL (ref 0.44–1.00)
GFR, Estimated: >60 mL/min (ref >60)
Glucose, Bld: 104 mg/dL — ABNORMAL HIGH (ref 70–99)
Potassium: 4.1 mmol/L (ref 3.5–5.1)
Sodium: 138 mmol/L (ref 135–145)

## 2021-04-19 NOTE — Progress Notes (Signed)
PCP - Antonieta Pert FNP  Chest x-ray - Not indicated EKG - 04/19/21  Sleep Study - Denies  DM - Denies  ERAS Protcol - Yes PRE-SURGERY Ensure    COVID TEST- Not indicated   Anesthesia review: No  Patient denies shortness of breath, fever, cough and chest pain at PAT appointment   All instructions explained to the patient, with a verbal understanding of the material. Patient agrees to go over the instructions while at home for a better understanding.  The opportunity to ask questions was provided.

## 2021-04-23 NOTE — H&P (Signed)
REFERRING PHYSICIAN: [name], MD  PROVIDER: Beverlee Nims, MD  MRN: [id number] DOB: 22-Oct-1985 DATE OF ENCOUNTER: 04/04/2021 Subjective  Chief Complaint: New Consultation (Umb Hernia )   History of Present Illness: [name] [name] is a 35 y.o. female who is seenas an office consultation at the request of Dr. Gaetano Net for evaluation of New Consultation (Umb Hernia ) .   This is a pleasant 35 year old female referred to me for evaluation of a hernia at her umbilicus. She has had a previous laparoscopic cholecystectomy and then more recently a laparoscopic removal of an ovary. She developed an infection at the incision at the umbilicus after this and then more recently noticed a hernia. She has minimal discomfort from this but can noticed the bulge which she reports reduces. She has had no obstructive symptoms. She is otherwise without complaints.  Review of Systems: A complete review of systems was obtained from the patient. I have reviewed this information and discussed as appropriate with the patient. See HPI as well for other ROS.  ROS   Medical History: Past Medical History:  Diagnosis Date   Anxiety   Hypertension   Thyroid disease   Patient Active Problem List  Diagnosis   Acquired hypothyroidism, unspecified   Anxiety   Cesarean delivery delivered   Chronic neutrophilia   Essential hypertension   Hypertensive disorder   GAD (generalized anxiety disorder)   History of right oophorectomy   S/P left oophorectomy   Morbid obesity with BMI of 50.0-59.9, adult (CMS-HCC)   Pregnant   Preterm premature rupture of membranes (PPROM) with unknown onset of labor   Previous cesarean section   Seasonal allergies   Umbilical cord prolapse   Past Surgical History:  Procedure Laterality Date   CESAREAN SECTION 2022  and 2017   COMBINED HYSTEROSCOPY DIAGNOSTIC / D&C 12/03/2016   LAPAROSCOPIC TUBAL LIGATION Bilateral 2022   Minor Carpal Tunnel Surgery Left 2014    Ovary Removed Right 2021    Allergies  Allergen Reactions   Ibuprofen Hives, Other (See Comments) and Rash  hives hives   Sulfa (Sulfonamide Antibiotics) Hives   Sulfamethoxazole-Trimethoprim Other (See Comments)  Hives Hives   Current Outpatient Medications on File Prior to Visit  Medication Sig Dispense Refill   famotidine (PEPCID) 40 MG tablet Take 1 tablet (40 mg total) by mouth 2 (two) times daily   fluticasone propionate (FLONASE) 50 mcg/actuation nasal spray fluticasone propionate 50 mcg/actuation nasal spray,suspension   labetaloL (TRANDATE) 100 MG tablet labetalol 100 mg tablet TAKE 1 TABLET BY MOUTH TWICE A DAY   levothyroxine (SYNTHROID) 25 MCG tablet Take by mouth   montelukast (SINGULAIR) 10 mg tablet montelukast 10 mg tablet   No current facility-administered medications on file prior to visit.   History reviewed. No pertinent family history.   Social History   Tobacco Use  Smoking Status Former   Types: Cigarettes   Quit date: 2013   Years since quitting: 9.9  Smokeless Tobacco Never    Social History   Socioeconomic History   Marital status: Married  Tobacco Use   Smoking status: Former  Types: Cigarettes  Quit date: 2013  Years since quitting: 9.9   Smokeless tobacco: Never  Vaping Use   Vaping Use: Never used  Substance and Sexual Activity   Alcohol use: Not Currently   Drug use: Never   Objective:   Vitals:  04/04/21 1520  BP: 130/80  Pulse: 97  Temp: 36.6 C (97.9 F)  SpO2: 97%  Weight: Marland Kitchen)  148.4 kg (327 lb 3.2 oz)  Height: 167.6 cm (5\' 6" )   Body mass index is 52.81 kg/m.  Physical Exam   She appears well on exam  Her abdomen is soft and obese.  There are 2 well-healed incisions at her umbilicus. There is an easily reducible hernia at the incisions.  Lungs clear  CV RRR Neuro grossly intact   Assessment and Plan:    Incisional hernia, without obstruction or gangrene    This is a small incisional hernia. I  discussed abdominal wall anatomy with her as well as hernia repair. She is interested in hernia repair. We discussed both the laparoscopic and open techniques as well as use of mesh. After discussion she wished to proceed with an open repair as an outpatient with mesh. I explained the surgical procedure in detail. We discussed the risk which includes but is not limited to bleeding, infection, injury to surrounding structures, the use of mesh, hernia recurrence, postoperative recovery, cardiopulmonary issues, etc. She understands and agrees to proceed with surgery which will be scheduled.

## 2021-04-24 ENCOUNTER — Ambulatory Visit (HOSPITAL_COMMUNITY): Payer: BC Managed Care – PPO | Admitting: Certified Registered Nurse Anesthetist

## 2021-04-24 ENCOUNTER — Other Ambulatory Visit: Payer: Self-pay

## 2021-04-24 ENCOUNTER — Ambulatory Visit (HOSPITAL_COMMUNITY)
Admission: RE | Admit: 2021-04-24 | Discharge: 2021-04-24 | Disposition: A | Discharge status: Home / Self Care | Payer: BC Managed Care – PPO | Attending: Surgery | Admitting: Surgery

## 2021-04-24 ENCOUNTER — Encounter (HOSPITAL_COMMUNITY)
Admission: RE | Disposition: A | Discharge status: Home / Self Care | Payer: Self-pay | Source: Home / Self Care | Attending: Surgery

## 2021-04-24 ENCOUNTER — Ambulatory Visit (HOSPITAL_COMMUNITY): Payer: BC Managed Care – PPO | Admitting: Vascular Surgery

## 2021-04-24 ENCOUNTER — Encounter (HOSPITAL_COMMUNITY): Payer: Self-pay | Admitting: Surgery

## 2021-04-24 DIAGNOSIS — Z90721 Acquired absence of ovaries, unilateral: Secondary | ICD-10-CM | POA: Insufficient documentation

## 2021-04-24 DIAGNOSIS — F419 Anxiety disorder, unspecified: Secondary | ICD-10-CM | POA: Insufficient documentation

## 2021-04-24 DIAGNOSIS — G5601 Carpal tunnel syndrome, right upper limb: Secondary | ICD-10-CM | POA: Insufficient documentation

## 2021-04-24 DIAGNOSIS — E039 Hypothyroidism, unspecified: Secondary | ICD-10-CM | POA: Insufficient documentation

## 2021-04-24 DIAGNOSIS — I10 Essential (primary) hypertension: Secondary | ICD-10-CM | POA: Diagnosis not present

## 2021-04-24 DIAGNOSIS — Z87891 Personal history of nicotine dependence: Secondary | ICD-10-CM | POA: Insufficient documentation

## 2021-04-24 DIAGNOSIS — Z9049 Acquired absence of other specified parts of digestive tract: Secondary | ICD-10-CM | POA: Diagnosis not present

## 2021-04-24 DIAGNOSIS — Z6841 Body Mass Index (BMI) 40.0 and over, adult: Secondary | ICD-10-CM | POA: Insufficient documentation

## 2021-04-24 DIAGNOSIS — K432 Incisional hernia without obstruction or gangrene: Secondary | ICD-10-CM | POA: Insufficient documentation

## 2021-04-24 HISTORY — PX: INCISIONAL HERNIA REPAIR: SHX193

## 2021-04-24 LAB — POCT PREGNANCY, URINE: Preg Test, Ur: NEGATIVE

## 2021-04-24 SURGERY — REPAIR, HERNIA, INCISIONAL
Anesthesia: General

## 2021-04-24 MED ORDER — CHLORHEXIDINE GLUCONATE CLOTH 2 % EX PADS: 6.0000 | MEDICATED_PAD | Freq: Once | CUTANEOUS | Status: DC

## 2021-04-24 MED ORDER — LACTATED RINGERS IV SOLN: INTRAVENOUS | Status: DC

## 2021-04-24 MED ORDER — DEXAMETHASONE SODIUM PHOSPHATE 10 MG/ML IJ SOLN
INTRAMUSCULAR | Status: DC | PRN
  Administered 2021-04-24: 10 mg via INTRAVENOUS

## 2021-04-24 MED ORDER — CHLORHEXIDINE GLUCONATE 0.12 % MT SOLN: 15.0000 mL | Freq: Once | OROMUCOSAL | Status: AC

## 2021-04-24 MED ORDER — CHLORHEXIDINE GLUCONATE 0.12 % MT SOLN
OROMUCOSAL | Status: AC
  Administered 2021-04-24: 12:00:00 15 mL via OROMUCOSAL
  Filled 2021-04-24: qty 15

## 2021-04-24 MED ORDER — PHENYLEPHRINE 40 MCG/ML (10ML) SYRINGE FOR IV PUSH (FOR BLOOD PRESSURE SUPPORT)
PREFILLED_SYRINGE | INTRAVENOUS | Status: DC | PRN
  Administered 2021-04-24: 120 ug via INTRAVENOUS
  Administered 2021-04-24 (×2): 80 ug via INTRAVENOUS
  Administered 2021-04-24: 120 ug via INTRAVENOUS

## 2021-04-24 MED ORDER — BUPIVACAINE-EPINEPHRINE 0.25% -1:200000 IJ SOLN
INTRAMUSCULAR | Status: DC | PRN
  Administered 2021-04-24: 20 mL

## 2021-04-24 MED ORDER — OXYCODONE HCL 5 MG PO TABS: 5.0000 mg | ORAL_TABLET | Freq: Once | ORAL | Status: DC | PRN

## 2021-04-24 MED ORDER — ACETAMINOPHEN 500 MG PO TABS: 1000.0000 mg | ORAL_TABLET | Freq: Once | ORAL | Status: DC

## 2021-04-24 MED ORDER — ACETAMINOPHEN 500 MG PO TABS
ORAL_TABLET | ORAL | Status: AC
  Administered 2021-04-24: 12:00:00 1000 mg via ORAL
  Filled 2021-04-24: qty 2

## 2021-04-24 MED ORDER — CEFAZOLIN IN SODIUM CHLORIDE 3-0.9 GM/100ML-% IV SOLN
3.0000 g | INTRAVENOUS | Status: AC
  Administered 2021-04-24: 14:00:00 3 g via INTRAVENOUS

## 2021-04-24 MED ORDER — BUPIVACAINE-EPINEPHRINE (PF) 0.25% -1:200000 IJ SOLN
INTRAMUSCULAR | Status: AC
  Filled 2021-04-24: qty 30

## 2021-04-24 MED ORDER — PROMETHAZINE HCL 25 MG/ML IJ SOLN
6.2500 mg | INTRAMUSCULAR | Status: DC | PRN
  Administered 2021-04-24: 15:00:00 6.25 mg via INTRAVENOUS

## 2021-04-24 MED ORDER — FENTANYL CITRATE (PF) 250 MCG/5ML IJ SOLN
INTRAMUSCULAR | Status: AC
  Filled 2021-04-24: qty 5

## 2021-04-24 MED ORDER — EPHEDRINE SULFATE-NACL 50-0.9 MG/10ML-% IV SOSY
PREFILLED_SYRINGE | INTRAVENOUS | Status: DC | PRN
  Administered 2021-04-24: 20 mg via INTRAVENOUS
  Administered 2021-04-24: 5 mg via INTRAVENOUS
  Administered 2021-04-24: 15 mg via INTRAVENOUS

## 2021-04-24 MED ORDER — ORAL CARE MOUTH RINSE: 15.0000 mL | Freq: Once | OROMUCOSAL | Status: AC

## 2021-04-24 MED ORDER — ENSURE PRE-SURGERY PO LIQD: 296.0000 mL | Freq: Once | ORAL | Status: DC

## 2021-04-24 MED ORDER — 0.9 % SODIUM CHLORIDE (POUR BTL) OPTIME
TOPICAL | Status: DC | PRN
  Administered 2021-04-24: 14:00:00 1000 mL

## 2021-04-24 MED ORDER — LIDOCAINE 2% (20 MG/ML) 5 ML SYRINGE
INTRAMUSCULAR | Status: DC | PRN
  Administered 2021-04-24: 60 mg via INTRAVENOUS

## 2021-04-24 MED ORDER — FENTANYL CITRATE (PF) 100 MCG/2ML IJ SOLN: 25.0000 ug | INTRAMUSCULAR | Status: DC | PRN

## 2021-04-24 MED ORDER — PROPOFOL 10 MG/ML IV BOLUS
INTRAVENOUS | Status: DC | PRN
  Administered 2021-04-24: 100 mg via INTRAVENOUS
  Administered 2021-04-24: 200 mg via INTRAVENOUS

## 2021-04-24 MED ORDER — FENTANYL CITRATE (PF) 250 MCG/5ML IJ SOLN
INTRAMUSCULAR | Status: DC | PRN
  Administered 2021-04-24: 50 ug via INTRAVENOUS
  Administered 2021-04-24 (×2): 100 ug via INTRAVENOUS

## 2021-04-24 MED ORDER — ONDANSETRON HCL 4 MG/2ML IJ SOLN
INTRAMUSCULAR | Status: DC | PRN
  Administered 2021-04-24: 4 mg via INTRAVENOUS

## 2021-04-24 MED ORDER — SCOPOLAMINE 1 MG/3DAYS TD PT72
MEDICATED_PATCH | TRANSDERMAL | Status: DC | PRN
  Administered 2021-04-24: 1 via TRANSDERMAL

## 2021-04-24 MED ORDER — ACETAMINOPHEN 500 MG PO TABS: 1000.0000 mg | ORAL_TABLET | ORAL | Status: AC

## 2021-04-24 MED ORDER — CEFAZOLIN IN SODIUM CHLORIDE 3-0.9 GM/100ML-% IV SOLN
INTRAVENOUS | Status: AC
  Filled 2021-04-24: qty 100

## 2021-04-24 MED ORDER — OXYCODONE HCL 5 MG/5ML PO SOLN: 5.0000 mg | Freq: Once | ORAL | Status: DC | PRN

## 2021-04-24 MED ORDER — MIDAZOLAM HCL 2 MG/2ML IJ SOLN
INTRAMUSCULAR | Status: AC
  Filled 2021-04-24: qty 2

## 2021-04-24 MED ORDER — MIDAZOLAM HCL 2 MG/2ML IJ SOLN
INTRAMUSCULAR | Status: DC | PRN
  Administered 2021-04-24: 2 mg via INTRAVENOUS

## 2021-04-24 MED ORDER — OXYCODONE HCL 5 MG PO TABS: 5.0000 mg | ORAL_TABLET | Freq: Four times a day (QID) | ORAL | 0 refills | Status: AC | PRN

## 2021-04-24 MED ORDER — PROMETHAZINE HCL 25 MG/ML IJ SOLN
INTRAMUSCULAR | Status: AC
  Filled 2021-04-24: qty 1

## 2021-04-24 SURGICAL SUPPLY — 38 items
BAG COUNTER SPONGE SURGICOUNT (BAG) ×1 IMPLANT
BAG SURGICOUNT SPONGE COUNTING (BAG)
BLADE CLIPPER SURG (BLADE) ×2 IMPLANT
CANISTER SUCT 3000ML PPV (MISCELLANEOUS) ×3 IMPLANT
CHLORAPREP W/TINT 26 (MISCELLANEOUS) ×3 IMPLANT
COVER SURGICAL LIGHT HANDLE (MISCELLANEOUS) ×3 IMPLANT
DERMABOND ADVANCED (GAUZE/BANDAGES/DRESSINGS) ×2
DERMABOND ADVANCED .7 DNX12 (GAUZE/BANDAGES/DRESSINGS) IMPLANT
DRAPE LAPAROSCOPIC ABDOMINAL (DRAPES) ×3 IMPLANT
DRSG TEGADERM 4X4.75 (GAUZE/BANDAGES/DRESSINGS) IMPLANT
ELECT REM PT RETURN 9FT ADLT (ELECTROSURGICAL) ×3
ELECTRODE REM PT RTRN 9FT ADLT (ELECTROSURGICAL) ×1 IMPLANT
GAUZE SPONGE 4X4 12PLY STRL (GAUZE/BANDAGES/DRESSINGS) IMPLANT
GLOVE SURG SIGNA 7.5 PF LTX (GLOVE) ×3 IMPLANT
GOWN STRL REUS W/ TWL LRG LVL3 (GOWN DISPOSABLE) ×1 IMPLANT
GOWN STRL REUS W/ TWL XL LVL3 (GOWN DISPOSABLE) ×1 IMPLANT
GOWN STRL REUS W/TWL LRG LVL3 (GOWN DISPOSABLE) ×2
GOWN STRL REUS W/TWL XL LVL3 (GOWN DISPOSABLE) ×2
KIT BASIN OR (CUSTOM PROCEDURE TRAY) ×3 IMPLANT
KIT TURNOVER KIT B (KITS) ×3 IMPLANT
MESH VENTRALEX ST 8CM LRG (Mesh General) ×2 IMPLANT
NDL HYPO 25GX1X1/2 BEV (NEEDLE) ×1 IMPLANT
NEEDLE HYPO 25GX1X1/2 BEV (NEEDLE) ×3 IMPLANT
NS IRRIG 1000ML POUR BTL (IV SOLUTION) ×3 IMPLANT
PACK GENERAL/GYN (CUSTOM PROCEDURE TRAY) ×3 IMPLANT
PAD ARMBOARD 7.5X6 YLW CONV (MISCELLANEOUS) ×3 IMPLANT
PENCIL SMOKE EVACUATOR (MISCELLANEOUS) ×1 IMPLANT
STAPLER VISISTAT 35W (STAPLE) IMPLANT
SUT MNCRL AB 4-0 PS2 18 (SUTURE) ×2 IMPLANT
SUT NOVA NAB DX-16 0-1 5-0 T12 (SUTURE) ×4 IMPLANT
SUT PROLENE 1 CT (SUTURE) IMPLANT
SUT VIC AB 2-0 CT1 36 (SUTURE) ×2 IMPLANT
SUT VIC AB 3-0 SH 27 (SUTURE) ×2
SUT VIC AB 3-0 SH 27XBRD (SUTURE) IMPLANT
SYR CONTROL 10ML LL (SYRINGE) ×2 IMPLANT
TOWEL GREEN STERILE (TOWEL DISPOSABLE) ×3 IMPLANT
TOWEL GREEN STERILE FF (TOWEL DISPOSABLE) ×3 IMPLANT
TRAY FOLEY MTR SLVR 14FR STAT (SET/KITS/TRAYS/PACK) IMPLANT

## 2021-04-24 NOTE — Transfer of Care (Signed)
Immediate Anesthesia Transfer of Care Note  Patient: [name] [name]  Procedure(s) Performed: OPEN INCISIONAL HERNIA REPAIR WITH MESH  Patient Location: PACU  Anesthesia Type:General  Level of Consciousness: awake, alert  and oriented  Airway & Oxygen Therapy: Patient connected to face mask oxygen  Post-op Assessment: Report given to RN and Post -op Vital signs reviewed and stable  Post vital signs: Reviewed and stable  Last Vitals:  Vitals Value Taken Time  BP 116/62 04/24/21 1422  Temp    Pulse 101 04/24/21 1428  Resp 34 04/24/21 1428  SpO2 97 % 04/24/21 1428  Vitals shown include unvalidated device data.  Last Pain:  Vitals:   04/24/21 1203  TempSrc:   PainSc: 0-No pain         Complications: No notable events documented.

## 2021-04-24 NOTE — Op Note (Signed)
OPEN INCISIONAL HERNIA REPAIR WITH MESH  Procedure Note  [name] [name] 04/24/2021   Pre-op Diagnosis: INCISIONAL HERNIA     Post-op Diagnosis: same  Procedure(s): OPEN INCISIONAL HERNIA REPAIR WITH MESH  Surgeon(s): Coralie Keens, MD  Anesthesia: General  Staff:  Circulator: Ronney Lion, RN Scrub Person: Rolan Bucco  Estimated Blood Loss: Minimal               Findings: The patient was found to have at least a 4 cm fascial defect which was repaired with an 8 cm round ventral Prolene coated patch from [employer].  Procedure: Patient was brought to operating identifies correct patient.  She is placed upon the operating table general anesthesia was induced.  Her abdomen was prepped and draped in usual sterile fashion.  I anesthetized the skin around her scar just below the umbilicus with Marcaine.  I then excised the scar with a scalpel.  I then dissected down through the underlying scar tissue and entered the hernia sac.  The hernia was found to contain small bowel and omentum.  There were only a few adhesions of omentum to the sac.  No bowel was adhesed to the sac.  I was able to easily reduce the bowel and omentum back in the abdominal cavity.  I had to make the incision larger secondary to the depth and thickness of her abdominal wall.  The fascial defect itself was about 4 cm in size.  I brought an 8 cm round ventral Prolene patch from [employer] onto the field.  I placed it through the fascia opening and then pulled it up against the peritoneum with the ties.  I then sutured the mesh and circumferentially with #1 Novafil sutures.  I then cut the ties and closed the fascia over the top of the mesh with figure-of-eight #1 Novafil sutures.  I then anesthetized the fascia circumferentially with Marcaine.  I then closed the dead space above the mesh and hernia sac with interrupted 2-0 Vicryl sutures.  I then closed the subtenons tissue with interrupted 3-0 Vicryl sutures and closed the  skin with a running 4-0 Monocryl.  Dermabond was then applied.  The patient tolerated the procedure well.  All the counts were correct at the end of the procedure.  The patient was then extubated in the operating room and taken in a stable condition to the recovery room.          Coralie Keens   Date: 04/24/2021  Time: 2:19 PM

## 2021-04-24 NOTE — Anesthesia Preprocedure Evaluation (Addendum)
Anesthesia Evaluation  Patient identified by MRN, date of birth, ID band Patient awake    Reviewed: Allergy & Precautions, NPO status , Patient's Chart, lab work & pertinent test results, reviewed documented beta Axe date and time   History of Anesthesia Complications (+) PONV and history of anesthetic complications  Airway Mallampati: III  TM Distance: >3 FB Neck ROM: Full    Dental  (+) Dental Advisory Given, Teeth Intact   Pulmonary former smoker,    Pulmonary exam normal        Cardiovascular hypertension, Pt. on medications and Pt. on home beta blockers Normal cardiovascular exam     Neuro/Psych PSYCHIATRIC DISORDERS Anxiety  Neuromuscular disease (CTS right wrist)    GI/Hepatic negative GI ROS, Neg liver ROS,   Endo/Other  Hypothyroidism Morbid obesity  Renal/GU negative Renal ROS     Musculoskeletal negative musculoskeletal ROS (+)   Abdominal   Peds  Hematology negative hematology ROS (+)   Anesthesia Other Findings   Reproductive/Obstetrics                           Anesthesia Physical Anesthesia Plan  ASA: 3  Anesthesia Plan: General   Post-op Pain Management:    Induction: Intravenous  PONV Risk Score and Plan: 4 or greater and Treatment may vary due to age or medical condition, Ondansetron, Dexamethasone, Midazolam and Scopolamine patch - Pre-op  Airway Management Planned: LMA  Additional Equipment: None  Intra-op Plan:   Post-operative Plan: Extubation in OR  Informed Consent: I have reviewed the patients History and Physical, chart, labs and discussed the procedure including the risks, benefits and alternatives for the proposed anesthesia with the patient or authorized representative who has indicated his/her understanding and acceptance.     Dental advisory given  Plan Discussed with: CRNA and Anesthesiologist  Anesthesia Plan Comments:         Anesthesia Quick Evaluation

## 2021-04-24 NOTE — Discharge Instructions (Signed)
[facility] _______[facility]  UMBILICAL OR INGUINAL HERNIA REPAIR: POST OP INSTRUCTIONS  Always review your discharge instruction sheet given to you by the facility where your surgery was performed. IF YOU HAVE DISABILITY OR FAMILY LEAVE FORMS, YOU MUST BRING THEM TO THE OFFICE FOR PROCESSING.   DO NOT GIVE THEM TO YOUR DOCTOR.  1. A  prescription for pain medication may be given to you upon discharge.  Take your pain medication as prescribed, if needed.  If narcotic pain medicine is not needed, then you may take acetaminophen (Tylenol) or ibuprofen (Advil) as needed. 2. Take your usually prescribed medications unless otherwise directed. If you need a refill on your pain medication, please contact your pharmacy.  They will contact our office to request authorization. Prescriptions will not be filled after 5 pm or on week-ends. 3. You should follow a light diet the first 24 hours after arrival home, such as soup and crackers, etc.  Be sure to include lots of fluids daily.  Resume your normal diet the day after surgery. 4.Most patients will experience some swelling and bruising around the umbilicus or in the groin and scrotum.  Ice packs and reclining will help.  Swelling and bruising can take several days to resolve.  6. It is common to experience some constipation if taking pain medication after surgery.  Increasing fluid intake and taking a stool softener (such as Colace) will usually help or prevent this problem from occurring.  A mild laxative (Milk of Magnesia or Miralax) should be taken according to package directions if there are no bowel movements after 48 hours. 7. Unless discharge instructions indicate otherwise, you may remove your bandages 24-48 hours after surgery, and you may shower at that time.  You may have steri-strips (small skin tapes) in place directly over the incision.  These strips should be left on the skin for 7-10 days.  If your surgeon used skin glue on the  incision, you may shower in 24 hours.  The glue will flake off over the next 2-3 weeks.  Any sutures or staples will be removed at the office during your follow-up visit. 8. ACTIVITIES:  You may resume regular (light) daily activities beginning the next day--such as daily self-care, walking, climbing stairs--gradually increasing activities as tolerated.  You may have sexual intercourse when it is comfortable.  Refrain from any heavy lifting or straining until approved by your doctor.  a.You may drive when you are no longer taking prescription pain medication, you can comfortably wear a seatbelt, and you can safely maneuver your car and apply brakes. b.RETURN TO WORK:   _____________________________________________  9.You should see your doctor in the office for a follow-up appointment approximately 2-3 weeks after your surgery.  Make sure that you call for this appointment within a day or two after you arrive home to insure a convenient appointment time. 10.OTHER INSTRUCTIONS: _OK TO SHOWER STARTING TOMORROW ICE PACK, TYLENOL, AND IBUPROFEN ALSO FOR PAIN NO LIFTING MORE THAN 15 TO 20 POUNDS FOR 6 WEEKS________________________    _____________________________________  WHEN TO CALL YOUR DOCTOR: Fever over 101.0 Inability to urinate Nausea and/or vomiting Extreme swelling or bruising Continued bleeding from incision. Increased pain, redness, or drainage from the incision  The clinic staff is available to answer your questions during regular business hours.  Please dont hesitate to call and ask to speak to one of the nurses for clinical concerns.  If you have a medical emergency, go to the nearest emergency room or call  911.  A surgeon from [facility] is always on call at the hospital   [address] [place], [place], [place]  [address] ?  [address], [place], [place]   [address] [phone] ? [phone] ? FAX [phone] Web site: [email or web address]

## 2021-04-24 NOTE — Anesthesia Procedure Notes (Signed)
Procedure Name: LMA Insertion Date/Time: 04/24/2021 1:30 PM Performed by: Minerva Ends, CRNA Pre-anesthesia Checklist: Patient identified, Emergency Drugs available, Suction available and Patient being monitored Patient Re-evaluated:Patient Re-evaluated prior to induction Oxygen Delivery Method: Circle system utilized Preoxygenation: Pre-oxygenation with 100% oxygen Induction Type: IV induction LMA: LMA inserted LMA Size: 4.0 Tube type: Oral Number of attempts: 1 Placement Confirmation: positive ETCO2 and breath sounds checked- equal and bilateral Tube secured with: Tape Dental Injury: Teeth and Oropharynx as per pre-operative assessment

## 2021-04-24 NOTE — Anesthesia Postprocedure Evaluation (Signed)
Anesthesia Post Note  Patient: [name] [name]  Procedure(s) Performed: OPEN INCISIONAL HERNIA REPAIR WITH MESH     Patient location during evaluation: PACU Anesthesia Type: General Level of consciousness: awake and alert Pain management: pain level controlled Vital Signs Assessment: post-procedure vital signs reviewed and stable Respiratory status: spontaneous breathing, nonlabored ventilation and respiratory function stable Cardiovascular status: stable and blood pressure returned to baseline Anesthetic complications: no   No notable events documented.  Last Vitals:  Vitals:   04/24/21 1440 04/24/21 1510  BP: (!) 142/96   Pulse: 92   Resp: 17   Temp:  36.5 C  SpO2: 100%     Last Pain:  Vitals:   04/24/21 1510  TempSrc:   PainSc: 0-No pain                 Audry Pili

## 2021-04-24 NOTE — Interval H&P Note (Signed)
History and Physical Interval Note: no change in H and P  04/24/2021 12:47 PM  [name] [name]  has presented today for surgery, with the diagnosis of INCISIONAL HERNIA.  The various methods of treatment have been discussed with the patient and family. After consideration of risks, benefits and other options for treatment, the patient has consented to  Procedure(s): OPEN HERNIA REPAIR INCISIONAL WITH MESH (N/A) as a surgical intervention.  The patient's history has been reviewed, patient examined, no change in status, stable for surgery.  I have reviewed the patient's chart and labs.  Questions were answered to the patient's satisfaction.     Coralie Keens

## 2021-04-25 ENCOUNTER — Encounter (HOSPITAL_COMMUNITY): Payer: Self-pay | Admitting: Surgery

## 2022-11-10 IMAGING — US US OB COMP LESS 14 WK
1 series · 15 of 26 positions shown · non-contrast
Comparison: None.

CLINICAL DATA: Bleeding, cramping

EXAM:
OBSTETRIC <14 WK ULTRASOUND
TECHNIQUE: Transabdominal ultrasound was performed for evaluation of the
gestation as well as the maternal uterus and adnexal regions.

[Series 1: us ob comp less 14 wk · 15 of 26 slices shown]
[im 1/26]
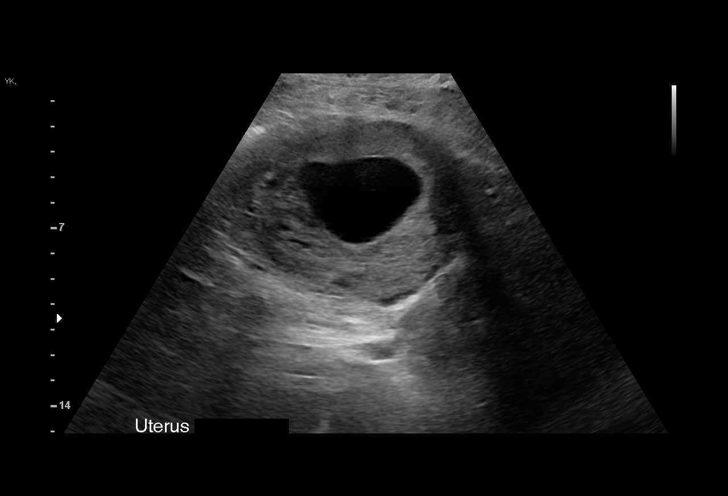
[im 3/26]
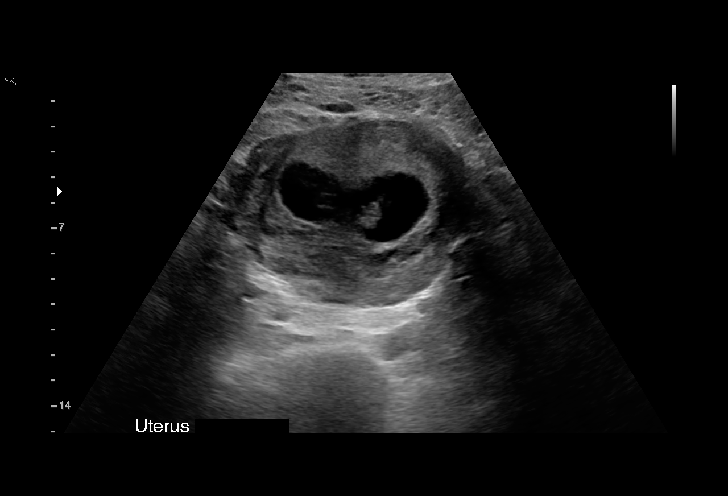
[im 5/26]
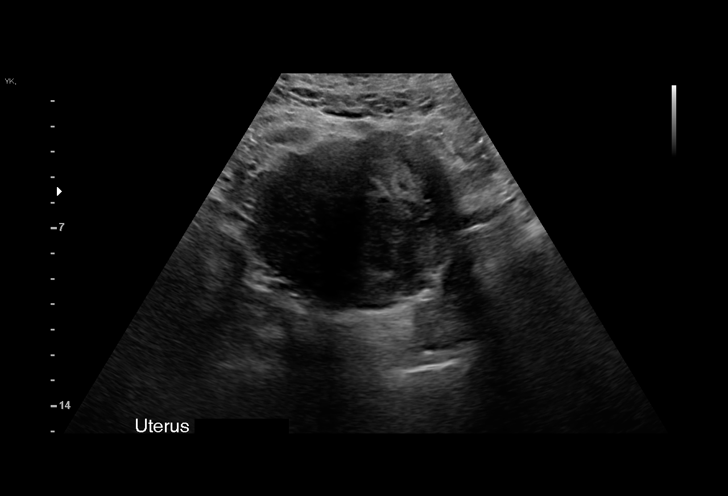
[im 7/26]
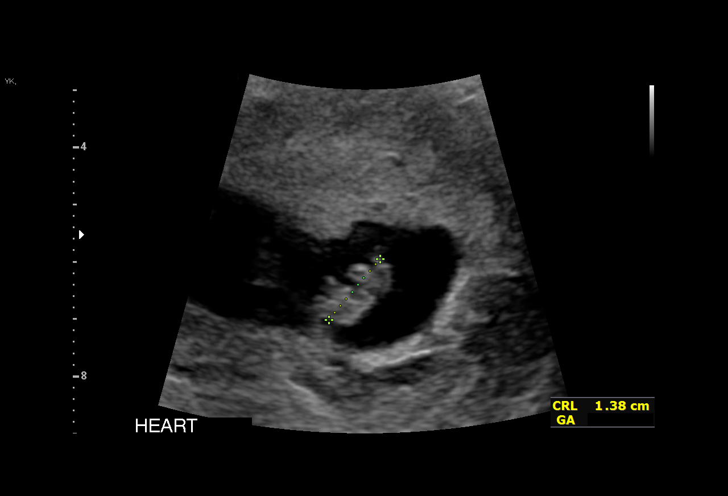
[im 8/26]
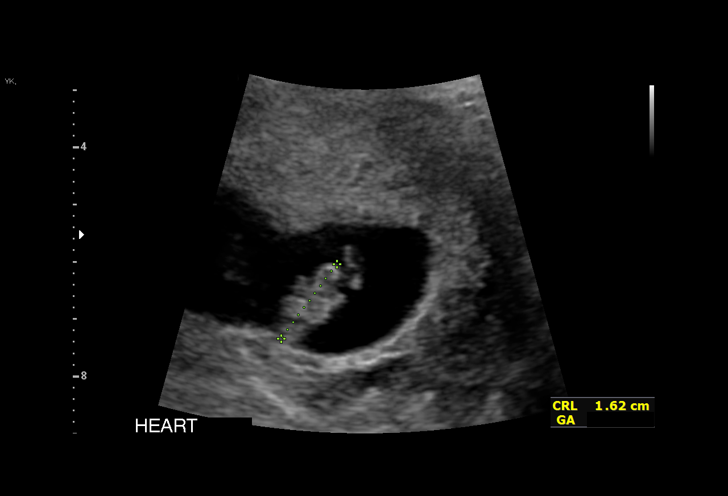
[im 10/26]
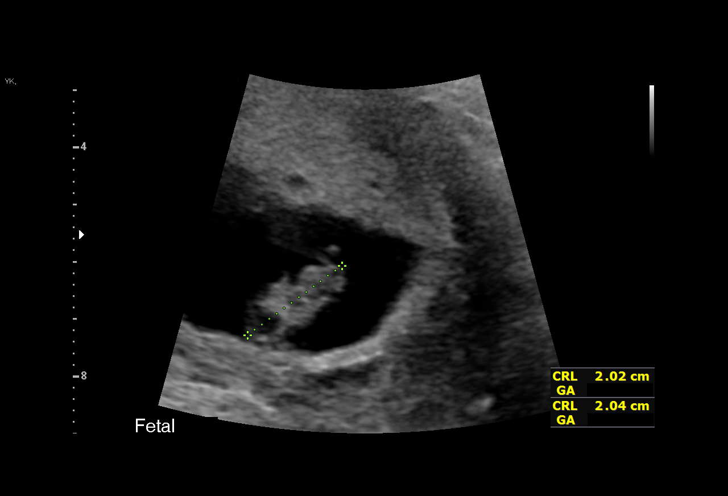
[im 12/26]
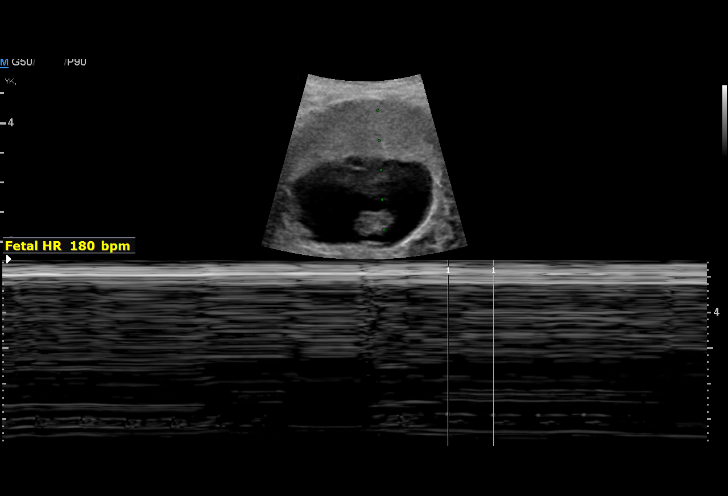
[im 14/26]
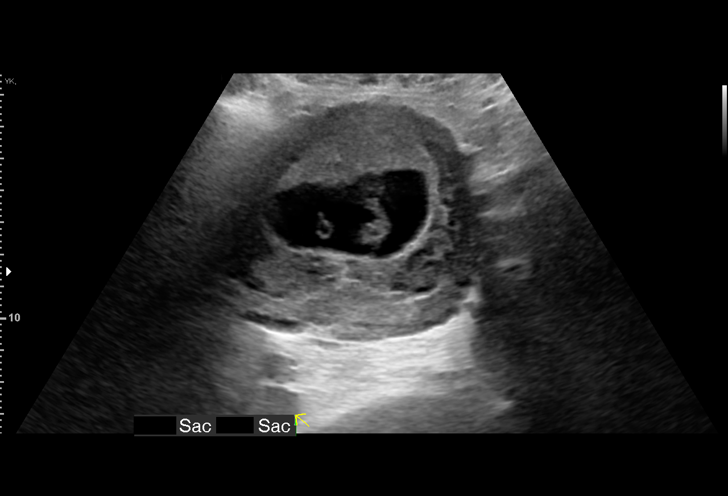
[im 15/26]
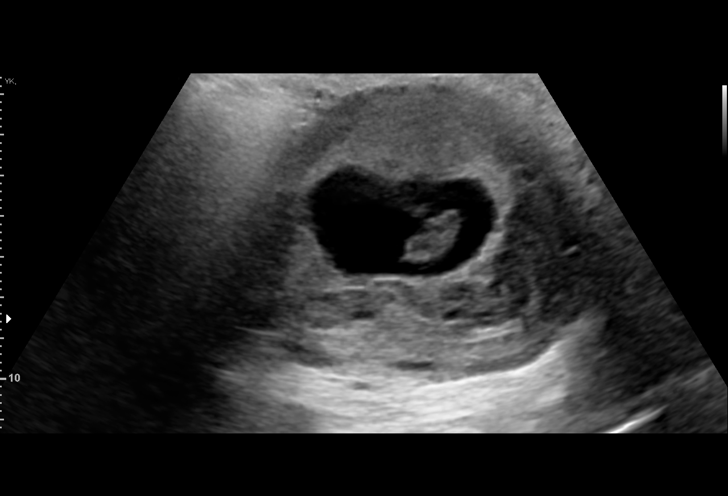
[im 17/26]
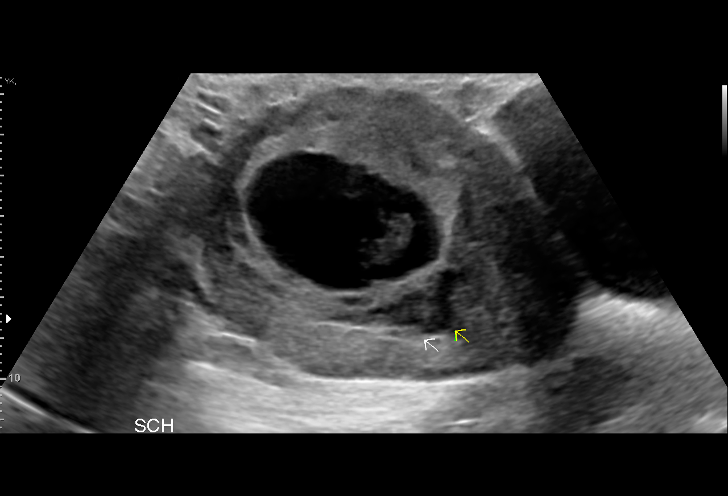
[im 19/26]
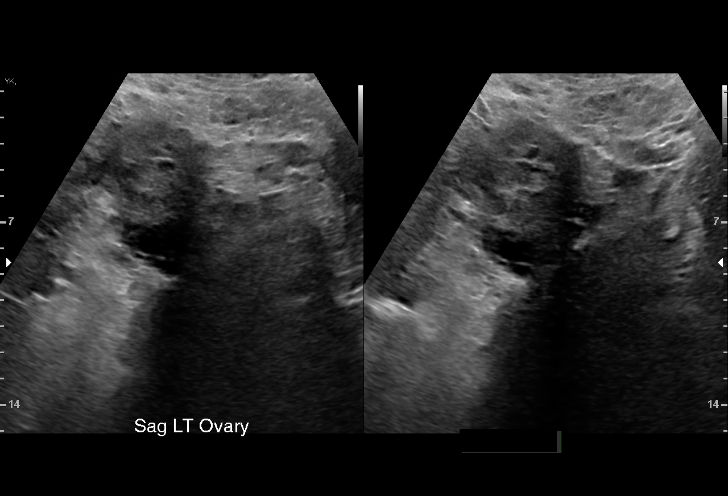
[im 20/26]
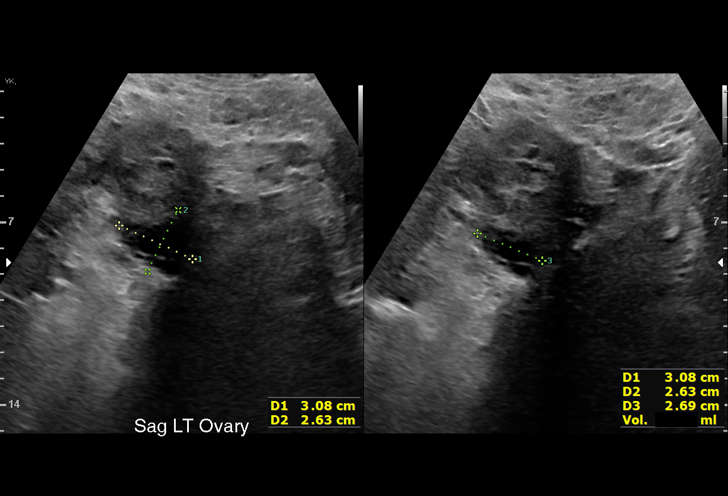
[im 22/26]
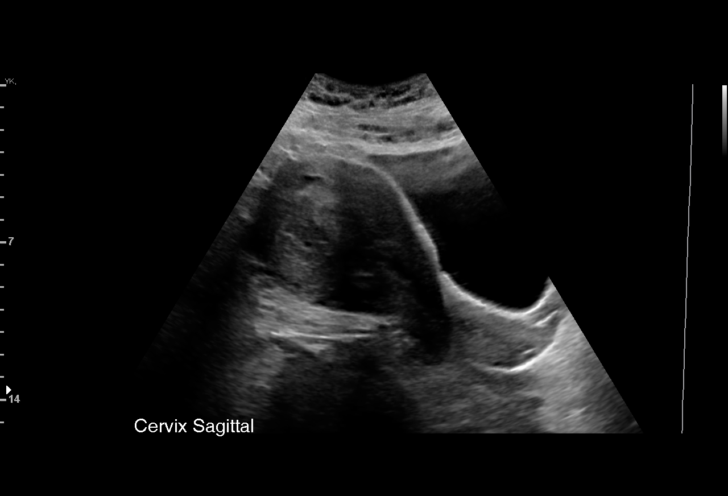
[im 24/26]
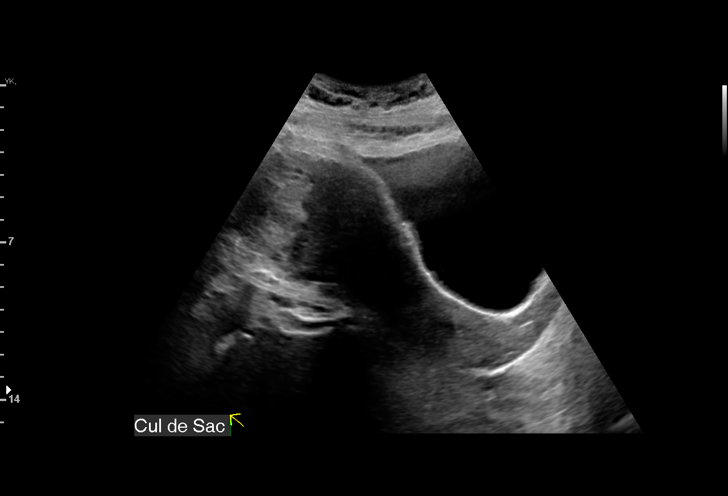
[im 26/26]
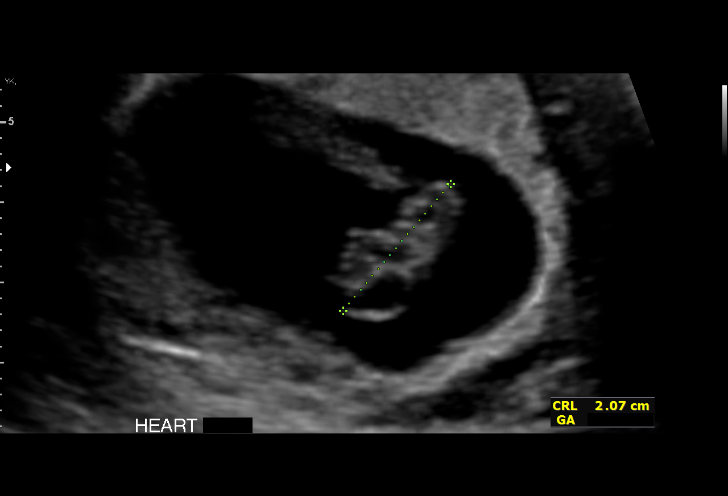

[15 of 26 positions shown; findings below may reference images not displayed]

FINDINGS: Intrauterine gestational sac: Single

Yolk sac:  Visualized

Embryo:  Visualized.

Cardiac Activity: Visualized.

Heart Rate: 176 bpm

MSD:    mm    w     d

CRL:   20.4 mm   8 w 4 d                  US EDC: 01/09/2021

Subchorionic hemorrhage:  Small subchorionic hemorrhage

Maternal uterus/adnexae: No adnexal mass or free fluid.
IMPRESSION: Eight week 4 day intrauterine pregnancy. Fetal heart rate 176 beats
per minute. Small subchorionic hemorrhage.

## 2023-05-24 ENCOUNTER — Telehealth: Payer: Self-pay | Admitting: Pharmacy Technician

## 2023-05-24 ENCOUNTER — Other Ambulatory Visit: Payer: Self-pay

## 2023-05-24 DIAGNOSIS — D509 Iron deficiency anemia, unspecified: Secondary | ICD-10-CM | POA: Insufficient documentation

## 2023-05-24 NOTE — Telephone Encounter (Signed)
Auth Submission: NO AUTH NEEDED Site of care: Site of care: [facility] Payer: [employer] Medication & CPT/J Code(s) submitted: Venofer (Iron Sucrose) J1756 Route of submission (phone, fax, portal):  Phone # Fax # Auth type: Buy/Bill PB Units/visits requested: X3 DOSES Reference number:  Approval from: 05/24/23 to 10/22/23

## 2023-05-31 ENCOUNTER — Ambulatory Visit: Payer: 59

## 2023-05-31 ENCOUNTER — Encounter: Payer: Self-pay | Admitting: Obstetrics and Gynecology

## 2023-05-31 VITALS — BP 110/75 | HR 85 | Temp 98.4°F | Resp 18 | Ht 66.0 in | Wt 309.8 lb

## 2023-05-31 DIAGNOSIS — D509 Iron deficiency anemia, unspecified: Secondary | ICD-10-CM

## 2023-05-31 MED ORDER — IRON SUCROSE 20 MG/ML IV SOLN
300.0000 mg | Freq: Once | INTRAVENOUS | Status: AC
  Administered 2023-05-31: 300 mg via INTRAVENOUS
  Filled 2023-05-31: qty 15

## 2023-05-31 MED ORDER — DIPHENHYDRAMINE HCL 25 MG PO CAPS
25.0000 mg | ORAL_CAPSULE | Freq: Once | ORAL | Status: AC
  Administered 2023-05-31: 25 mg via ORAL
  Filled 2023-05-31: qty 1

## 2023-05-31 MED ORDER — ACETAMINOPHEN 325 MG PO TABS
650.0000 mg | ORAL_TABLET | Freq: Once | ORAL | Status: AC
  Administered 2023-05-31: 650 mg via ORAL
  Filled 2023-05-31: qty 2

## 2023-05-31 MED ORDER — IRON SUCROSE 300 MG IVPB - SIMPLE MED: 300.0000 mg | Status: DC

## 2023-05-31 NOTE — Progress Notes (Signed)
Diagnosis: Iron Deficiency Anemia  Provider:  Chilton Greathouse MD  Procedure: IV Infusion  IV Type: Peripheral, IV Location: L Forearm  Venofer (Iron Sucrose), Dose: 300 mg  Infusion Start Time: 1351  Infusion Stop Time: 1531  Post Infusion IV Care: Observation period completed and Peripheral IV Discontinued  Discharge: Condition: Good, Destination: Home . AVS Declined  Performed by:  Loney Hering, LPN

## 2023-06-07 ENCOUNTER — Ambulatory Visit (INDEPENDENT_AMBULATORY_CARE_PROVIDER_SITE_OTHER): Payer: 59

## 2023-06-07 VITALS — BP 115/79 | HR 88 | Temp 98.3°F | Resp 16 | Ht 66.0 in | Wt 308.4 lb

## 2023-06-07 DIAGNOSIS — D509 Iron deficiency anemia, unspecified: Secondary | ICD-10-CM | POA: Diagnosis not present

## 2023-06-07 MED ORDER — IRON SUCROSE 300 MG IVPB - SIMPLE MED
300.0000 mg | Status: DC
Start: 2023-06-07 — End: 2023-06-07

## 2023-06-07 MED ORDER — ACETAMINOPHEN 325 MG PO TABS
650.0000 mg | ORAL_TABLET | Freq: Once | ORAL | Status: AC
  Administered 2023-06-07: 650 mg via ORAL
  Filled 2023-06-07: qty 2

## 2023-06-07 MED ORDER — DIPHENHYDRAMINE HCL 25 MG PO CAPS
25.0000 mg | ORAL_CAPSULE | Freq: Once | ORAL | Status: AC
Start: 2023-06-07 — End: 2023-06-07
  Administered 2023-06-07: 25 mg via ORAL
  Filled 2023-06-07: qty 1

## 2023-06-07 MED ORDER — SODIUM CHLORIDE 0.9 % IV SOLN
300.0000 mg | Freq: Once | INTRAVENOUS | Status: AC
  Administered 2023-06-07: 300 mg via INTRAVENOUS
  Filled 2023-06-07: qty 15

## 2023-06-07 NOTE — Progress Notes (Signed)
Diagnosis: Iron Deficiency Anemia  Provider:  Chilton Greathouse MD  Procedure: IV Infusion  IV Type: Peripheral, IV Location: R Antecubital  Venofer (Iron Sucrose), Dose: 300 mg  Infusion Start Time: 1402  Infusion Stop Time: 1546  Post Infusion IV Care: Observation period completed and Peripheral IV Discontinued  Discharge: Condition: Good, Destination: Home . AVS Declined  Performed by:  Rico Ala, LPN

## 2023-06-14 ENCOUNTER — Ambulatory Visit: Payer: Self-pay

## 2023-06-17 ENCOUNTER — Ambulatory Visit (INDEPENDENT_AMBULATORY_CARE_PROVIDER_SITE_OTHER): Payer: Self-pay

## 2023-06-17 VITALS — BP 121/87 | HR 74 | Temp 98.7°F | Resp 16 | Ht 66.0 in | Wt 306.4 lb

## 2023-06-17 DIAGNOSIS — D509 Iron deficiency anemia, unspecified: Secondary | ICD-10-CM

## 2023-06-17 MED ORDER — DIPHENHYDRAMINE HCL 25 MG PO CAPS
25.0000 mg | ORAL_CAPSULE | Freq: Once | ORAL | Status: AC
  Administered 2023-06-17: 25 mg via ORAL
  Filled 2023-06-17: qty 1

## 2023-06-17 MED ORDER — SODIUM CHLORIDE 0.9 % IV SOLN
300.0000 mg | INTRAVENOUS | Status: DC
  Administered 2023-06-17: 300 mg via INTRAVENOUS
  Filled 2023-06-17: qty 15

## 2023-06-17 MED ORDER — ACETAMINOPHEN 325 MG PO TABS
650.0000 mg | ORAL_TABLET | Freq: Once | ORAL | Status: AC
  Administered 2023-06-17: 650 mg via ORAL
  Filled 2023-06-17: qty 2

## 2023-06-17 NOTE — Progress Notes (Signed)
Diagnosis: Iron Deficiency Anemia  Provider:  Chilton Greathouse MD  Procedure: IV Infusion  IV Type: Peripheral, IV Location: L Forearm  Venofer (Iron Sucrose), Dose: 300 mg  Infusion Start Time: 1446  Infusion Stop Time: 16109  Post Infusion IV Care: Observation period completed and Peripheral IV Discontinued  Discharge: Condition: Good, Destination: Home . AVS Declined  Performed by:  Adriana Mccallum, RN
